# Patient Record
Sex: Female | Born: 1940 | Race: White | Hispanic: No | Marital: Married | Smoking: Never smoker
Health system: Southern US, Community
[De-identification: ages and names within clinical notes are randomized; demographics above are authoritative.]

## PROBLEM LIST (undated history)

## (undated) DIAGNOSIS — J189 Pneumonia, unspecified organism: Secondary | ICD-10-CM

## (undated) DIAGNOSIS — E785 Hyperlipidemia, unspecified: Secondary | ICD-10-CM

## (undated) DIAGNOSIS — E041 Nontoxic single thyroid nodule: Secondary | ICD-10-CM

## (undated) DIAGNOSIS — E039 Hypothyroidism, unspecified: Secondary | ICD-10-CM

## (undated) DIAGNOSIS — J302 Other seasonal allergic rhinitis: Secondary | ICD-10-CM

## (undated) DIAGNOSIS — R55 Syncope and collapse: Secondary | ICD-10-CM

## (undated) DIAGNOSIS — M1990 Unspecified osteoarthritis, unspecified site: Secondary | ICD-10-CM

## (undated) DIAGNOSIS — R42 Dizziness and giddiness: Secondary | ICD-10-CM

## (undated) DIAGNOSIS — I493 Ventricular premature depolarization: Secondary | ICD-10-CM

## (undated) DIAGNOSIS — E210 Primary hyperparathyroidism: Secondary | ICD-10-CM

## (undated) DIAGNOSIS — K635 Polyp of colon: Secondary | ICD-10-CM

## (undated) DIAGNOSIS — I10 Essential (primary) hypertension: Secondary | ICD-10-CM

## (undated) DIAGNOSIS — M8580 Other specified disorders of bone density and structure, unspecified site: Secondary | ICD-10-CM

## (undated) DIAGNOSIS — R030 Elevated blood-pressure reading, without diagnosis of hypertension: Secondary | ICD-10-CM

## (undated) DIAGNOSIS — H3530 Unspecified macular degeneration: Secondary | ICD-10-CM

## (undated) HISTORY — DX: Dizziness and giddiness: R42

## (undated) HISTORY — DX: Elevated blood-pressure reading, without diagnosis of hypertension: R03.0

## (undated) HISTORY — PX: PARATHYROIDECTOMY: SHX19

## (undated) HISTORY — DX: Other specified disorders of bone density and structure, unspecified site: M85.80

## (undated) HISTORY — DX: Nontoxic single thyroid nodule: E04.1

## (undated) HISTORY — DX: Hypothyroidism, unspecified: E03.9

## (undated) HISTORY — PX: COLONOSCOPY: SHX174

## (undated) HISTORY — DX: Hyperlipidemia, unspecified: E78.5

## (undated) HISTORY — DX: Syncope and collapse: R55

## (undated) HISTORY — DX: Polyp of colon: K63.5

## (undated) HISTORY — DX: Primary hyperparathyroidism: E21.0

## (undated) HISTORY — DX: Pneumonia, unspecified organism: J18.9

---

## 1986-07-18 HISTORY — PX: ABDOMINAL HYSTERECTOMY: SHX81

## 1998-07-18 HISTORY — PX: OTHER SURGICAL HISTORY: SHX169

## 1999-01-20 ENCOUNTER — Encounter: Payer: Self-pay | Admitting: Endocrinology

## 1999-01-20 ENCOUNTER — Ambulatory Visit (HOSPITAL_COMMUNITY): Admission: RE | Admit: 1999-01-20 | Discharge: 1999-01-20 | Payer: Self-pay | Admitting: Endocrinology

## 1999-03-04 ENCOUNTER — Encounter: Payer: Self-pay | Admitting: General Surgery

## 1999-03-09 ENCOUNTER — Ambulatory Visit (HOSPITAL_COMMUNITY): Admission: RE | Admit: 1999-03-09 | Discharge: 1999-03-10 | Payer: Self-pay | Admitting: General Surgery

## 1999-06-14 ENCOUNTER — Other Ambulatory Visit: Admission: RE | Admit: 1999-06-14 | Discharge: 1999-06-14 | Payer: Self-pay | Admitting: Obstetrics and Gynecology

## 2001-02-09 ENCOUNTER — Other Ambulatory Visit: Admission: RE | Admit: 2001-02-09 | Discharge: 2001-02-09 | Payer: Self-pay | Admitting: Obstetrics and Gynecology

## 2006-01-16 ENCOUNTER — Encounter: Admission: RE | Admit: 2006-01-16 | Discharge: 2006-01-16 | Payer: Self-pay | Admitting: Obstetrics and Gynecology

## 2006-08-01 ENCOUNTER — Encounter: Admission: RE | Admit: 2006-08-01 | Discharge: 2006-08-01 | Payer: Self-pay | Admitting: Obstetrics and Gynecology

## 2006-10-24 ENCOUNTER — Ambulatory Visit (HOSPITAL_COMMUNITY): Admission: RE | Admit: 2006-10-24 | Discharge: 2006-10-24 | Payer: Self-pay | Admitting: Endocrinology

## 2007-08-13 ENCOUNTER — Encounter: Admission: RE | Admit: 2007-08-13 | Discharge: 2007-08-13 | Payer: Self-pay | Admitting: Obstetrics and Gynecology

## 2008-07-09 ENCOUNTER — Ambulatory Visit (HOSPITAL_COMMUNITY): Admission: RE | Admit: 2008-07-09 | Discharge: 2008-07-09 | Payer: Self-pay | Admitting: Endocrinology

## 2008-10-28 ENCOUNTER — Encounter: Admission: RE | Admit: 2008-10-28 | Discharge: 2008-10-28 | Payer: Self-pay | Admitting: Obstetrics and Gynecology

## 2009-06-22 DIAGNOSIS — M899 Disorder of bone, unspecified: Secondary | ICD-10-CM | POA: Insufficient documentation

## 2009-06-22 DIAGNOSIS — E559 Vitamin D deficiency, unspecified: Secondary | ICD-10-CM | POA: Insufficient documentation

## 2009-06-22 DIAGNOSIS — E785 Hyperlipidemia, unspecified: Secondary | ICD-10-CM | POA: Insufficient documentation

## 2009-07-20 ENCOUNTER — Ambulatory Visit (HOSPITAL_COMMUNITY): Admission: RE | Admit: 2009-07-20 | Discharge: 2009-07-20 | Payer: Self-pay | Admitting: Endocrinology

## 2009-12-02 ENCOUNTER — Encounter: Admission: RE | Admit: 2009-12-02 | Discharge: 2009-12-02 | Payer: Self-pay | Admitting: Obstetrics and Gynecology

## 2010-04-29 ENCOUNTER — Encounter: Admission: RE | Admit: 2010-04-29 | Discharge: 2010-04-29 | Payer: Self-pay | Admitting: Otolaryngology

## 2010-08-04 ENCOUNTER — Ambulatory Visit (HOSPITAL_COMMUNITY)
Admission: RE | Admit: 2010-08-04 | Discharge: 2010-08-04 | Payer: Self-pay | Source: Home / Self Care | Attending: Endocrinology | Admitting: Endocrinology

## 2011-05-17 ENCOUNTER — Other Ambulatory Visit: Payer: Self-pay | Admitting: Obstetrics and Gynecology

## 2011-05-17 DIAGNOSIS — T148XXA Other injury of unspecified body region, initial encounter: Secondary | ICD-10-CM

## 2011-06-01 ENCOUNTER — Ambulatory Visit
Admission: RE | Admit: 2011-06-01 | Discharge: 2011-06-01 | Disposition: A | Discharge status: Home / Self Care | Payer: Medicare Other | Source: Ambulatory Visit | Attending: Obstetrics and Gynecology | Admitting: Obstetrics and Gynecology

## 2011-06-01 ENCOUNTER — Other Ambulatory Visit: Payer: Self-pay | Admitting: Obstetrics and Gynecology

## 2011-06-01 DIAGNOSIS — T148XXA Other injury of unspecified body region, initial encounter: Secondary | ICD-10-CM

## 2011-06-17 ENCOUNTER — Ambulatory Visit (INDEPENDENT_AMBULATORY_CARE_PROVIDER_SITE_OTHER): Payer: Self-pay | Admitting: Surgery

## 2011-06-17 ENCOUNTER — Ambulatory Visit (INDEPENDENT_AMBULATORY_CARE_PROVIDER_SITE_OTHER): Payer: Medicare Other | Admitting: Surgery

## 2011-06-17 ENCOUNTER — Encounter (INDEPENDENT_AMBULATORY_CARE_PROVIDER_SITE_OTHER): Payer: Self-pay | Admitting: Surgery

## 2011-06-17 VITALS — BP 138/86 | HR 80 | Temp 97.6°F | Resp 16 | Ht 64.0 in | Wt 188.0 lb

## 2011-06-17 DIAGNOSIS — N6459 Other signs and symptoms in breast: Secondary | ICD-10-CM

## 2011-06-17 DIAGNOSIS — N6452 Nipple discharge: Secondary | ICD-10-CM

## 2011-06-17 NOTE — Progress Notes (Signed)
Patient ID: Crystal Spears, female   DOB: 05/19/1941, 70 y.o.   MRN: [id number]  Chief Complaint  Patient presents with  . Other    Eval left breast nipple lesion    HPI Crystal Spears is a 70 y.o. female.  HPI The patient is sent at the request of Dr. Henderson Cloud due to bleeding from the left nipple. She was seen about a month ago due to swelling at the left nipple and bleeding. She underwent ultrasound and mammographic evaluation which showed a cystic structure in the superficial left nipple. She denies any pain. She denies any mass. About 2 weeks ago it resolved. Past Medical History  Diagnosis Date  . Hyperlipidemia   . Osteoporosis     Past Surgical History  Procedure Date  . Cesarean section 1972, 1978  . Abdominal hysterectomy 1988  . Parathyroid gland removal 2000    approximation of date    Family History  Problem Relation Age of Onset  . Stroke Mother 37  . Cancer Father 44    lung    Social History History  Substance Use Topics  . Smoking status: Never Smoker   . Smokeless tobacco: Never Used  . Alcohol Use: No    No Known Allergies  Current Outpatient Prescriptions  Medication Sig Dispense Refill  . atorvastatin (LIPITOR) 20 MG tablet Daily.      . Calcium Carbonate-Vitamin D (CALCIUM + D PO) Take by mouth daily.          Review of Systems Review of Systems  Constitutional: Negative for fever, chills and unexpected weight change.  HENT: Negative for hearing loss, congestion, sore throat, trouble swallowing and voice change.   Eyes: Negative for visual disturbance.  Respiratory: Negative for cough and wheezing.   Cardiovascular: Negative for chest pain, palpitations and leg swelling.  Gastrointestinal: Negative for nausea, vomiting, abdominal pain, diarrhea, constipation, blood in stool, abdominal distention and anal bleeding.  Genitourinary: Negative for hematuria, vaginal bleeding and difficulty urinating.  Musculoskeletal: Negative for arthralgias.    Skin: Negative for rash and wound.  Neurological: Negative for seizures, syncope and headaches.  Hematological: Negative for adenopathy. Does not bruise/bleed easily.  Psychiatric/Behavioral: Negative for confusion.    Blood pressure 138/86, pulse 80, temperature 97.6 F (36.4 C), temperature source Temporal, resp. rate 16, height 5\' 4"  (1.626 m), weight 188 lb (85.276 kg).  Physical Exam Physical Exam  Constitutional: She appears well-developed and well-nourished.  HENT:  Head: Normocephalic and atraumatic.  Eyes: EOM are normal. Pupils are equal, round, and reactive to light.  Neck: Normal range of motion. Neck supple.  Cardiovascular: Normal rate and regular rhythm.   No murmur heard. Pulmonary/Chest: Effort normal and breath sounds normal. No respiratory distress.       Both breasts examined. Left pressures a small 5 mm cystic structure within the nipple which appears to be a small varicosity. No mass in left breast. No drainage from left nipple. Left axilla normal. Right breast normal. Right axilla normal.    Data Reviewed Mammogram and left breast u/s  Small cystic structure surface of nipple.  Assessment      Varicosity of breast ( Mondor's disease)    Plan      Follow up three months.  It has resolved.  Call if symptoms recur.       [name]. 06/17/2011, 10:50 AM

## 2011-06-17 NOTE — Patient Instructions (Signed)
Follow up 3 months.  Cal if any further drainage or bleeding.

## 2011-07-19 HISTORY — PX: CATARACT EXTRACTION, BILATERAL: SHX1313

## 2011-08-05 ENCOUNTER — Other Ambulatory Visit (HOSPITAL_COMMUNITY): Payer: Self-pay | Admitting: *Deleted

## 2011-08-10 ENCOUNTER — Encounter (HOSPITAL_COMMUNITY): Payer: Self-pay

## 2011-08-10 ENCOUNTER — Encounter (HOSPITAL_COMMUNITY)
Admission: RE | Admit: 2011-08-10 | Discharge: 2011-08-10 | Disposition: A | Discharge status: Home / Self Care | Payer: Medicare Other | Source: Ambulatory Visit | Attending: Endocrinology | Admitting: Endocrinology

## 2011-08-10 DIAGNOSIS — M810 Age-related osteoporosis without current pathological fracture: Secondary | ICD-10-CM | POA: Insufficient documentation

## 2011-08-10 MED ORDER — ZOLEDRONIC ACID 5 MG/100ML IV SOLN
5.0000 mg | INTRAVENOUS | Status: AC
  Administered 2011-08-10: 5 mg via INTRAVENOUS
  Filled 2011-08-10: qty 100

## 2011-08-10 MED ORDER — SODIUM CHLORIDE 0.9 % IV SOLN
INTRAVENOUS | Status: AC
  Administered 2011-08-10: 10:00:00 via INTRAVENOUS

## 2011-10-28 ENCOUNTER — Encounter (INDEPENDENT_AMBULATORY_CARE_PROVIDER_SITE_OTHER): Payer: Self-pay | Admitting: Surgery

## 2011-10-28 ENCOUNTER — Ambulatory Visit (INDEPENDENT_AMBULATORY_CARE_PROVIDER_SITE_OTHER): Payer: Medicare Other | Admitting: Surgery

## 2011-10-28 VITALS — BP 130/80 | Ht 64.0 in | Wt 185.0 lb

## 2011-10-28 DIAGNOSIS — N6459 Other signs and symptoms in breast: Secondary | ICD-10-CM

## 2011-10-28 DIAGNOSIS — N6452 Nipple discharge: Secondary | ICD-10-CM

## 2011-10-28 NOTE — Patient Instructions (Signed)
You will be scheduled for surgery.

## 2011-10-28 NOTE — Progress Notes (Signed)
Patient ID: Crystal Spears, female   DOB: 02/06/1941, 70 y.o.   MRN: [id number]  No chief complaint on file.   HPI Crystal Spears is a 70 y.o. female.  HPI The patient is sent at the request of Dr. Horvath due to bleeding from the left nipple. She was seen about a month ago due to swelling at the left nipple and bleeding. She underwent ultrasound and mammographic evaluation which showed a cystic structure in the superficial left nipple. She denies any pain. She denies any mass. It continues to drain bloody fluid intermittently. Past Medical History  Diagnosis Date  . Hyperlipidemia   . Osteoporosis     Past Surgical History  Procedure Date  . Cesarean section 1972, 1978  . Abdominal hysterectomy 1988  . Parathyroid gland removal 2000    approximation of date    Family History  Problem Relation Age of Onset  . Stroke Mother 85  . Cancer Father 65    lung    Social History History  Substance Use Topics  . Smoking status: Never Smoker   . Smokeless tobacco: Never Used  . Alcohol Use: No    No Known Allergies  Current Outpatient Prescriptions  Medication Sig Dispense Refill  . atorvastatin (LIPITOR) 20 MG tablet Daily.      . Calcium Carbonate-Vitamin D (CALCIUM + D PO) Take by mouth 2 (two) times daily.       . Multiple Vitamin (MULTIVITAMIN) capsule Take 1 capsule by mouth daily.      . Vitamin D, Ergocalciferol, (DRISDOL) 50000 UNITS CAPS Take 50,000 Units by mouth every 30 (thirty) days.        Review of Systems Review of Systems  Constitutional: Negative for fever, chills and unexpected weight change.  HENT: Negative for hearing loss, congestion, sore throat, trouble swallowing and voice change.   Eyes: Negative for visual disturbance.  Respiratory: Negative for cough and wheezing.   Cardiovascular: Negative for chest pain, palpitations and leg swelling.  Gastrointestinal: Negative for nausea, vomiting, abdominal pain, diarrhea, constipation, blood in stool,  abdominal distention and anal bleeding.  Genitourinary: Negative for hematuria, vaginal bleeding and difficulty urinating.  Musculoskeletal: Negative for arthralgias.  Skin: Negative for rash and wound.  Neurological: Negative for seizures, syncope and headaches.  Hematological: Negative for adenopathy. Does not bruise/bleed easily.  Psychiatric/Behavioral: Negative for confusion.    Blood pressure 130/80, height 5' 4" (1.626 m), weight 185 lb (83.915 kg).  Physical Exam Physical Exam  Constitutional: She appears well-developed and well-nourished.  HENT:  Head: Normocephalic and atraumatic.  Eyes: EOM are normal. Pupils are equal, round, and reactive to light.  Neck: Normal range of motion. Neck supple.  Cardiovascular: Normal rate and regular rhythm.   No murmur heard. Pulmonary/Chest: Effort normal and breath sounds normal. No respiratory distress.       Both breasts examined. Left pressures a small 5 mm cystic structure within the nipple which appears to be a small varicosity. No mass in left breast. No drainage from left nipple. Left axilla normal. Right breast normal. Right axilla normal.    Data Reviewed Mammogram and left breast u/s  Small cystic structure surface of nipple.  Assessment     Left nipple cyst  And drainage  Not resolved    Plan     Left breast central duct excision.  The nipple itself will need the area excised since the cyst is in the actual skin along with central duct system.The procedure has been discussed   with the patient.  Alternative therapies have been discussed with the patient.  Operative risks include bleeding,  Infection, nipple loss  Organ injury,  Nerve injury,  Blood vessel injury,  DVT,  Pulmonary embolism,  Death,  And possible reoperation.  Medical management risks include worsening of present situation.  The success of the procedure is 50 -90 % at treating patients symptoms.  The patient understands and agrees to proceed.        Kaitlin Alcindor A. 10/28/2011, 10:27 AM    

## 2011-11-11 ENCOUNTER — Encounter (HOSPITAL_BASED_OUTPATIENT_CLINIC_OR_DEPARTMENT_OTHER): Payer: Self-pay | Admitting: *Deleted

## 2011-11-11 NOTE — Progress Notes (Signed)
To come in for ccs labs and cxr 

## 2011-11-14 ENCOUNTER — Encounter (HOSPITAL_BASED_OUTPATIENT_CLINIC_OR_DEPARTMENT_OTHER)
Admission: RE | Admit: 2011-11-14 | Discharge: 2011-11-14 | Disposition: A | Discharge status: Home / Self Care | Payer: Medicare Other | Source: Ambulatory Visit | Attending: Surgery | Admitting: Surgery

## 2011-11-14 ENCOUNTER — Ambulatory Visit
Admission: RE | Admit: 2011-11-14 | Discharge: 2011-11-14 | Disposition: A | Discharge status: Home / Self Care | Payer: Medicare Other | Source: Ambulatory Visit | Attending: Surgery | Admitting: Surgery

## 2011-11-14 LAB — COMPREHENSIVE METABOLIC PANEL
ALT: 29 U/L (ref 0–35)
AST: 28 U/L (ref 0–37)
Alkaline Phosphatase: 109 U/L (ref 39–117)
BUN: 17 mg/dL (ref 6–23)
CO2: 29 mEq/L (ref 19–32)
Calcium: 9.6 mg/dL (ref 8.4–10.5)
Creatinine, Ser: 0.8 mg/dL (ref 0.50–1.10)
GFR calc Af Amer: 86 mL/min — ABNORMAL LOW (ref >90)
Glucose, Bld: 83 mg/dL (ref 70–99)
Potassium: 4.7 mEq/L (ref 3.5–5.1)
Sodium: 142 mEq/L (ref 135–145)
Total Bilirubin: 0.5 mg/dL (ref 0.3–1.2)

## 2011-11-14 LAB — DIFFERENTIAL
Basophils Absolute: 0.1 10*3/uL (ref 0.0–0.1)
Eosinophils Absolute: 0.1 10*3/uL (ref 0.0–0.7)
Eosinophils Relative: 2 % (ref 0–5)
Neutro Abs: 3.4 10*3/uL (ref 1.7–7.7)
Neutrophils Relative %: 54 % (ref 43–77)

## 2011-11-14 LAB — CBC
MCH: 32.5 pg (ref 26.0–34.0)
MCV: 95.7 fL (ref 78.0–100.0)
Platelets: 263 10*3/uL (ref 150–400)
WBC: 6.4 10*3/uL (ref 4.0–10.5)

## 2011-11-16 ENCOUNTER — Encounter (HOSPITAL_BASED_OUTPATIENT_CLINIC_OR_DEPARTMENT_OTHER): Payer: Self-pay | Admitting: *Deleted

## 2011-11-16 ENCOUNTER — Ambulatory Visit (HOSPITAL_BASED_OUTPATIENT_CLINIC_OR_DEPARTMENT_OTHER)
Admission: RE | Admit: 2011-11-16 | Discharge: 2011-11-16 | Disposition: A | Discharge status: Home / Self Care | Payer: Medicare Other | Source: Ambulatory Visit | Attending: Surgery | Admitting: Surgery

## 2011-11-16 ENCOUNTER — Encounter (HOSPITAL_BASED_OUTPATIENT_CLINIC_OR_DEPARTMENT_OTHER)
Admission: RE | Disposition: A | Discharge status: Home / Self Care | Payer: Self-pay | Source: Ambulatory Visit | Attending: Surgery

## 2011-11-16 ENCOUNTER — Ambulatory Visit (HOSPITAL_BASED_OUTPATIENT_CLINIC_OR_DEPARTMENT_OTHER): Payer: Medicare Other | Admitting: Certified Registered Nurse Anesthetist

## 2011-11-16 ENCOUNTER — Encounter (HOSPITAL_BASED_OUTPATIENT_CLINIC_OR_DEPARTMENT_OTHER): Payer: Self-pay | Admitting: Certified Registered Nurse Anesthetist

## 2011-11-16 DIAGNOSIS — D249 Benign neoplasm of unspecified breast: Secondary | ICD-10-CM

## 2011-11-16 DIAGNOSIS — M810 Age-related osteoporosis without current pathological fracture: Secondary | ICD-10-CM | POA: Insufficient documentation

## 2011-11-16 DIAGNOSIS — E785 Hyperlipidemia, unspecified: Secondary | ICD-10-CM | POA: Insufficient documentation

## 2011-11-16 DIAGNOSIS — N6452 Nipple discharge: Secondary | ICD-10-CM

## 2011-11-16 HISTORY — DX: Other seasonal allergic rhinitis: J30.2

## 2011-11-16 HISTORY — PX: BREAST DUCTAL SYSTEM EXCISION: SHX5242

## 2011-11-16 HISTORY — PX: BREAST BIOPSY: SHX20

## 2011-11-16 SURGERY — EXCISION DUCTAL SYSTEM BREAST
Anesthesia: General | Site: Breast | Laterality: Left | Wound class: Clean

## 2011-11-16 MED ORDER — FENTANYL CITRATE 0.05 MG/ML IJ SOLN
INTRAMUSCULAR | Status: DC | PRN
  Administered 2011-11-16: 100 ug via INTRAVENOUS

## 2011-11-16 MED ORDER — ONDANSETRON HCL 4 MG/2ML IJ SOLN
INTRAMUSCULAR | Status: DC | PRN
  Administered 2011-11-16: 4 mg via INTRAVENOUS

## 2011-11-16 MED ORDER — MIDAZOLAM HCL 5 MG/5ML IJ SOLN
INTRAMUSCULAR | Status: DC | PRN
  Administered 2011-11-16: 2 mg via INTRAVENOUS

## 2011-11-16 MED ORDER — PROPOFOL 10 MG/ML IV EMUL
INTRAVENOUS | Status: DC | PRN
  Administered 2011-11-16: 160 mg via INTRAVENOUS

## 2011-11-16 MED ORDER — ONDANSETRON HCL 4 MG/2ML IJ SOLN: 4.0000 mg | Freq: Once | INTRAMUSCULAR | Status: DC | PRN

## 2011-11-16 MED ORDER — HYDROMORPHONE HCL PF 1 MG/ML IJ SOLN: 0.2500 mg | INTRAMUSCULAR | Status: DC | PRN

## 2011-11-16 MED ORDER — BUPIVACAINE-EPINEPHRINE 0.25% -1:200000 IJ SOLN
INTRAMUSCULAR | Status: DC | PRN
  Administered 2011-11-16: 10 mL

## 2011-11-16 MED ORDER — HYDROCODONE-ACETAMINOPHEN 5-325 MG PO TABS: 1.0000 | ORAL_TABLET | Freq: Four times a day (QID) | ORAL | Status: DC | PRN

## 2011-11-16 MED ORDER — DEXAMETHASONE SODIUM PHOSPHATE 4 MG/ML IJ SOLN
INTRAMUSCULAR | Status: DC | PRN
  Administered 2011-11-16: 10 mg via INTRAVENOUS

## 2011-11-16 MED ORDER — HYDROCODONE-ACETAMINOPHEN 5-325 MG PO TABS
1.0000 | ORAL_TABLET | Freq: Four times a day (QID) | ORAL | Status: DC | PRN
  Administered 2011-11-16: 1 via ORAL

## 2011-11-16 MED ORDER — CEFAZOLIN SODIUM-DEXTROSE 2-3 GM-% IV SOLR
2.0000 g | INTRAVENOUS | Status: AC
  Administered 2011-11-16: 2 g via INTRAVENOUS

## 2011-11-16 MED ORDER — LACTATED RINGERS IV SOLN
INTRAVENOUS | Status: DC
  Administered 2011-11-16: 08:00:00 via INTRAVENOUS

## 2011-11-16 SURGICAL SUPPLY — 43 items
ADH SKN CLS APL DERMABOND .7 (GAUZE/BANDAGES/DRESSINGS) ×1
BLADE SURG 15 STRL LF DISP TIS (BLADE) ×1 IMPLANT
BLADE SURG 15 STRL SS (BLADE) ×2
CANISTER SUCTION 1200CC (MISCELLANEOUS) ×2 IMPLANT
CHLORAPREP W/TINT 26ML (MISCELLANEOUS) ×2 IMPLANT
CLIP TI WIDE RED SMALL 6 (CLIP) IMPLANT
CLOTH BEACON ORANGE TIMEOUT ST (SAFETY) ×2 IMPLANT
COVER MAYO STAND STRL (DRAPES) ×2 IMPLANT
COVER TABLE BACK 60X90 (DRAPES) ×2 IMPLANT
DECANTER SPIKE VIAL GLASS SM (MISCELLANEOUS) ×2 IMPLANT
DERMABOND ADVANCED (GAUZE/BANDAGES/DRESSINGS) ×1
DERMABOND ADVANCED .7 DNX12 (GAUZE/BANDAGES/DRESSINGS) ×1 IMPLANT
DEVICE DUBIN W/COMP PLATE 8390 (MISCELLANEOUS) IMPLANT
DRAPE LAPAROSCOPIC ABDOMINAL (DRAPES) IMPLANT
DRAPE PED LAPAROTOMY (DRAPES) ×2 IMPLANT
DRAPE UTILITY XL STRL (DRAPES) ×2 IMPLANT
ELECT COATED BLADE 2.86 ST (ELECTRODE) ×2 IMPLANT
ELECT REM PT RETURN 9FT ADLT (ELECTROSURGICAL) ×2
ELECTRODE REM PT RTRN 9FT ADLT (ELECTROSURGICAL) ×1 IMPLANT
GLOVE BIOGEL M STRL SZ7.5 (GLOVE) ×1 IMPLANT
GLOVE BIOGEL PI IND STRL 8 (GLOVE) ×1 IMPLANT
GLOVE BIOGEL PI INDICATOR 8 (GLOVE) ×1
GLOVE ECLIPSE 8.0 STRL XLNG CF (GLOVE) ×2 IMPLANT
GOWN PREVENTION PLUS XLARGE (GOWN DISPOSABLE) ×2 IMPLANT
GOWN PREVENTION PLUS XXLARGE (GOWN DISPOSABLE) ×1 IMPLANT
NDL HYPO 25X1 1.5 SAFETY (NEEDLE) ×1 IMPLANT
NEEDLE HYPO 25X1 1.5 SAFETY (NEEDLE) ×2 IMPLANT
NS IRRIG 1000ML POUR BTL (IV SOLUTION) ×2 IMPLANT
PACK BASIN DAY SURGERY FS (CUSTOM PROCEDURE TRAY) ×2 IMPLANT
PENCIL BUTTON HOLSTER BLD 10FT (ELECTRODE) ×2 IMPLANT
SLEEVE SCD COMPRESS KNEE MED (MISCELLANEOUS) ×2 IMPLANT
SPONGE LAP 4X18 X RAY DECT (DISPOSABLE) ×2 IMPLANT
STAPLER VISISTAT 35W (STAPLE) IMPLANT
SUT MON AB 4-0 PC3 18 (SUTURE) ×2 IMPLANT
SUT SILK 2 0 SH (SUTURE) IMPLANT
SUT VIC AB 3-0 SH 27 (SUTURE) ×2
SUT VIC AB 3-0 SH 27X BRD (SUTURE) ×1 IMPLANT
SYR CONTROL 10ML LL (SYRINGE) ×2 IMPLANT
TOWEL OR 17X24 6PK STRL BLUE (TOWEL DISPOSABLE) ×3 IMPLANT
TOWEL OR NON WOVEN STRL DISP B (DISPOSABLE) ×2 IMPLANT
TUBE CONNECTING 20X1/4 (TUBING) ×2 IMPLANT
WATER STERILE IRR 1000ML POUR (IV SOLUTION) ×2 IMPLANT
YANKAUER SUCT BULB TIP NO VENT (SUCTIONS) ×2 IMPLANT

## 2011-11-16 NOTE — Anesthesia Postprocedure Evaluation (Signed)
  Anesthesia Post-op Note  Patient: Crystal Spears  Procedure(s) Performed: Procedure(s) (LRB): EXCISION DUCTAL SYSTEM BREAST (Left)  Patient Location: PACU  Anesthesia Type: General  Level of Consciousness: awake, alert  and oriented  Airway and Oxygen Therapy: Patient Spontanous Breathing  Post-op Pain: mild  Post-op Assessment: Post-op Vital signs reviewed and Patient's Cardiovascular Status Stable  Post-op Vital Signs: stable  Complications: No apparent anesthesia complications

## 2011-11-16 NOTE — Anesthesia Procedure Notes (Addendum)
Performed by: Signa Kell C   Procedure Name: LMA Insertion Date/Time: 11/16/2011 8:38 AM Performed by: Burna Cash Pre-anesthesia Checklist: Patient identified, Emergency Drugs available, Suction available and Patient being monitored Patient Re-evaluated:Patient Re-evaluated prior to inductionOxygen Delivery Method: Circle System Utilized Preoxygenation: Pre-oxygenation with 100% oxygen Intubation Type: IV induction Ventilation: Mask ventilation without difficulty LMA: LMA inserted LMA Size: 4.0 Number of attempts: 1 Airway Equipment and Method: bite block Placement Confirmation: positive ETCO2 Tube secured with: Tape Dental Injury: Teeth and Oropharynx as per pre-operative assessment

## 2011-11-16 NOTE — Anesthesia Preprocedure Evaluation (Signed)
Anesthesia Evaluation  Patient identified by MRN, date of birth, ID band Patient awake    Reviewed: Allergy & Precautions, H&P , NPO status , Patient's Chart, lab work & pertinent test results  Airway Mallampati: II TM Distance: >3 FB Neck ROM: Full    Dental  (+) Teeth Intact   Pulmonary  breath sounds clear to auscultation        Cardiovascular Rhythm:Regular Rate:Normal     Neuro/Psych    GI/Hepatic   Endo/Other    Renal/GU      Musculoskeletal   Abdominal   Peds  Hematology   Anesthesia Other Findings   Reproductive/Obstetrics                           Anesthesia Physical Anesthesia Plan  ASA: II  Anesthesia Plan: General   Post-op Pain Management:    Induction: Intravenous  Airway Management Planned: LMA  Additional Equipment:   Intra-op Plan:   Post-operative Plan:   Informed Consent: I have reviewed the patients History and Physical, chart, labs and discussed the procedure including the risks, benefits and alternatives for the proposed anesthesia with the patient or authorized representative who has indicated his/her understanding and acceptance.   Dental advisory given  Plan Discussed with:   Anesthesia Plan Comments: (Mild obesity L. Breast mass  Plan GA with LMA  Kipp Brood, MD)        Anesthesia Quick Evaluation

## 2011-11-16 NOTE — H&P (View-Only) (Signed)
Patient ID: Crystal Spears, female   DOB: August 26, 1940, 71 y.o.   MRN: [id number]  No chief complaint on file.   HPI Crystal Spears is a 71 y.o. female.  HPI The patient is sent at the request of Dr. Henderson Cloud due to bleeding from the left nipple. She was seen about a month ago due to swelling at the left nipple and bleeding. She underwent ultrasound and mammographic evaluation which showed a cystic structure in the superficial left nipple. She denies any pain. She denies any mass. It continues to drain bloody fluid intermittently. Past Medical History  Diagnosis Date  . Hyperlipidemia   . Osteoporosis     Past Surgical History  Procedure Date  . Cesarean section 1972, 1978  . Abdominal hysterectomy 1988  . Parathyroid gland removal 2000    approximation of date    Family History  Problem Relation Age of Onset  . Stroke Mother 90  . Cancer Father 56    lung    Social History History  Substance Use Topics  . Smoking status: Never Smoker   . Smokeless tobacco: Never Used  . Alcohol Use: No    No Known Allergies  Current Outpatient Prescriptions  Medication Sig Dispense Refill  . atorvastatin (LIPITOR) 20 MG tablet Daily.      . Calcium Carbonate-Vitamin D (CALCIUM + D PO) Take by mouth 2 (two) times daily.       . Multiple Vitamin (MULTIVITAMIN) capsule Take 1 capsule by mouth daily.      . Vitamin D, Ergocalciferol, (DRISDOL) 50000 UNITS CAPS Take 50,000 Units by mouth every 30 (thirty) days.        Review of Systems Review of Systems  Constitutional: Negative for fever, chills and unexpected weight change.  HENT: Negative for hearing loss, congestion, sore throat, trouble swallowing and voice change.   Eyes: Negative for visual disturbance.  Respiratory: Negative for cough and wheezing.   Cardiovascular: Negative for chest pain, palpitations and leg swelling.  Gastrointestinal: Negative for nausea, vomiting, abdominal pain, diarrhea, constipation, blood in stool,  abdominal distention and anal bleeding.  Genitourinary: Negative for hematuria, vaginal bleeding and difficulty urinating.  Musculoskeletal: Negative for arthralgias.  Skin: Negative for rash and wound.  Neurological: Negative for seizures, syncope and headaches.  Hematological: Negative for adenopathy. Does not bruise/bleed easily.  Psychiatric/Behavioral: Negative for confusion.    Blood pressure 130/80, height 5\' 4"  (1.626 m), weight 185 lb (83.915 kg).  Physical Exam Physical Exam  Constitutional: She appears well-developed and well-nourished.  HENT:  Head: Normocephalic and atraumatic.  Eyes: EOM are normal. Pupils are equal, round, and reactive to light.  Neck: Normal range of motion. Neck supple.  Cardiovascular: Normal rate and regular rhythm.   No murmur heard. Pulmonary/Chest: Effort normal and breath sounds normal. No respiratory distress.       Both breasts examined. Left pressures a small 5 mm cystic structure within the nipple which appears to be a small varicosity. No mass in left breast. No drainage from left nipple. Left axilla normal. Right breast normal. Right axilla normal.    Data Reviewed Mammogram and left breast u/s  Small cystic structure surface of nipple.  Assessment     Left nipple cyst  And drainage  Not resolved    Plan     Left breast central duct excision.  The nipple itself will need the area excised since the cyst is in the actual skin along with central duct system.The procedure has been discussed  with the patient.  Alternative therapies have been discussed with the patient.  Operative risks include bleeding,  Infection, nipple loss  Organ injury,  Nerve injury,  Blood vessel injury,  DVT,  Pulmonary embolism,  Death,  And possible reoperation.  Medical management risks include worsening of present situation.  The success of the procedure is 50 -90 % at treating patients symptoms.  The patient understands and agrees to proceed.        Daxtyn Rottenberg A. 10/28/2011, 10:27 AM

## 2011-11-16 NOTE — Discharge Instructions (Signed)
[facility] °Office Phone Number [phone] ° °BREAST BIOPSY/ PARTIAL MASTECTOMY: POST OP INSTRUCTIONS ° °Always review your discharge instruction sheet given to you by the facility where your surgery was performed. ° °IF YOU HAVE DISABILITY OR FAMILY LEAVE FORMS, YOU MUST BRING THEM TO THE OFFICE FOR PROCESSING.  DO NOT GIVE THEM TO YOUR DOCTOR. ° °1. A prescription for pain medication may be given to you upon discharge.  Take your pain medication as prescribed, if needed.  If narcotic pain medicine is not needed, then you may take acetaminophen (Tylenol) or ibuprofen (Advil) as needed. °2. Take your usually prescribed medications unless otherwise directed °3. If you need a refill on your pain medication, please contact your pharmacy.  They will contact our office to request authorization.  Prescriptions will not be filled after 5pm or on week-ends. °4. You should eat very light the first 24 hours after surgery, such as soup, crackers, pudding, etc.  Resume your normal diet the day after surgery. °5. Most patients will experience some swelling and bruising in the breast.  Ice packs and a good support bra will help.  Swelling and bruising can take several days to resolve.  °6. It is common to experience some constipation if taking pain medication after surgery.  Increasing fluid intake and taking a stool softener will usually help or prevent this problem from occurring.  A mild laxative (Milk of Magnesia or Miralax) should be taken according to package directions if there are no bowel movements after 48 hours. °7. Unless discharge instructions indicate otherwise, you may remove your bandages 24-48 hours after surgery, and you may shower at that time.  You may have steri-strips (small skin tapes) in place directly over the incision.  These strips should be left on the skin for 7-10 days.  If your surgeon used skin glue on the incision, you may shower in 24 hours.  The glue will flake off over the  next 2-3 weeks.  Any sutures or staples will be removed at the office during your follow-up visit. °8. ACTIVITIES:  You may resume regular daily activities (gradually increasing) beginning the next day.  Wearing a good support bra or sports bra minimizes pain and swelling.  You may have sexual intercourse when it is comfortable. °a. You may drive when you no longer are taking prescription pain medication, you can comfortably wear a seatbelt, and you can safely maneuver your car and apply brakes. °b. RETURN TO WORK:  ______________________________________________________________________________________ °9. You should see your doctor in the office for a follow-up appointment approximately two weeks after your surgery.  Your doctor’s nurse will typically make your follow-up appointment when she calls you with your pathology report.  Expect your pathology report 2-3 business days after your surgery.  You may call to check if you do not hear from us after three days. °10. OTHER INSTRUCTIONS: _______________________________________________________________________________________________ _____________________________________________________________________________________________________________________________________ °_____________________________________________________________________________________________________________________________________ °_____________________________________________________________________________________________________________________________________ ° °WHEN TO CALL YOUR DOCTOR: °1. Fever over 101.0 °2. Nausea and/or vomiting. °3. Extreme swelling or bruising. °4. Continued bleeding from incision. °5. Increased pain, redness, or drainage from the incision. ° °The clinic staff is available to answer your questions during regular business hours.  Please don’t hesitate to call and ask to speak to one of the nurses for clinical concerns.  If you have a medical emergency, go to the nearest  emergency room or call 911.  A surgeon from [facility] is always on call at the hospital. ° °For further questions, please visit [email or web address]  ° ° °  Post Anesthesia Home Care Instructions ° °Activity: °Get plenty of rest for the remainder of the day. A responsible adult should stay with you for 24 hours following the procedure.  °For the next 24 hours, DO NOT: °-Drive a car °-Operate machinery °-Drink alcoholic beverages °-Take any medication unless instructed by your physician °-Make any legal decisions or sign important papers. ° °Meals: °Start with liquid foods such as gelatin or soup. Progress to regular foods as tolerated. Avoid greasy, spicy, heavy foods. If nausea and/or vomiting occur, drink only clear liquids until the nausea and/or vomiting subsides. Call your physician if vomiting continues. ° °Special Instructions/Symptoms: °Your throat may feel dry or sore from the anesthesia or the breathing tube placed in your throat during surgery. If this causes discomfort, gargle with warm salt water. The discomfort should disappear within 24 hours. ° ° °Call your surgeon if you experience:  ° °1.  Fever over 101.0. °2.  Inability to urinate. °3.  Nausea and/or vomiting. °4.  Extreme swelling or bruising at the surgical site. °5.  Continued bleeding from the incision. °6.  Increased pain, redness or drainage from the incision. °7.  Problems related to your pain medication. ° ° °

## 2011-11-16 NOTE — Op Note (Signed)
Preop diagnosis:   Left breast Bloody nipple discharge  Postop diagnosis: Same  Procedure: Central breast duct excision  Surgeon: Harriette Bouillon M.D.  Anesthesia: Gen. endotracheal with local anesthesia  EBL: Minimal  Specimen: Breast tissue to pathology  Drains: None  Indications for procedure: The patient presents for central duct excision of the breast due to bloody nipple discharge. Workup included mammography and ultrasound. A filling defect was noted in the central ducts of the breast. Discussion of operative and nonoperative means were done with the patient. Risks of operative excision include bleeding, infection, recurrence, and the need for the surgery, seroma, anesthesia risks. Observation is the other choice discussed. The patient wishes to proceed for excision.  Description of procedure: The patient was seen all the area. Questions are answered. The left breast was marked. The patient was taken back to the operating room. The patient was placed supine on the operating room table. General anesthesia was initiated. Upper chest was prepped and draped in a sterile fashion. A timeout was done. She received preoperative antibiotics within an hour of incision. A vertical  incision was made from the border of the nipple from 6 oclock to the central nipple. The nipple was elevated carefully. The central duct was identified using  a probe and correlated with preoperative imaging. The duct and neighboring tissue were excised. This was sent to pathology. The wound was irrigated and then closed with 3-0 Vicryl and 4-0 Monocryl in a subcuticular fashion Dermabond was applied. Patient was awoke and taken to recovery in satisfactory condition. All final counts the sponge, instruments and needles were correct.

## 2011-11-16 NOTE — Transfer of Care (Signed)
Immediate Anesthesia Transfer of Care Note  Patient: Crystal Spears  Procedure(s) Performed: Procedure(s) (LRB): EXCISION DUCTAL SYSTEM BREAST (Left)  Patient Location: PACU  Anesthesia Type: General  Level of Consciousness: sedated  Airway & Oxygen Therapy: Patient Spontanous Breathing and Patient connected to face mask oxygen  Post-op Assessment: Report given to PACU RN and Post -op Vital signs reviewed and stable  Post vital signs: Reviewed and stable  Complications: No apparent anesthesia complications

## 2011-11-16 NOTE — Interval H&P Note (Signed)
History and Physical Interval Note:  11/16/2011 8:19 AM  Crystal Spears  has presented today for surgery, with the diagnosis of left nipple dischage  The various methods of treatment have been discussed with the patient and family. After consideration of risks, benefits and other options for treatment, the patient has consented to  Procedure(s) (LRB): EXCISION DUCTAL SYSTEM BREAST (Left) as a surgical intervention .  The patients' history has been reviewed, patient examined, no change in status, stable for surgery.  I have reviewed the patients' chart and labs.  Questions were answered to the patient's satisfaction.     Lavada Langsam A.

## 2011-11-18 ENCOUNTER — Telehealth (INDEPENDENT_AMBULATORY_CARE_PROVIDER_SITE_OTHER): Payer: Self-pay

## 2011-11-18 ENCOUNTER — Encounter (HOSPITAL_BASED_OUTPATIENT_CLINIC_OR_DEPARTMENT_OTHER): Payer: Self-pay | Admitting: Surgery

## 2011-11-18 NOTE — Telephone Encounter (Signed)
Pt called for path result. Path result given to pt and copy to [facility] for pts office appt.Marland Kitchen

## 2011-11-25 ENCOUNTER — Encounter (INDEPENDENT_AMBULATORY_CARE_PROVIDER_SITE_OTHER): Payer: Self-pay | Admitting: Surgery

## 2011-11-25 ENCOUNTER — Ambulatory Visit (INDEPENDENT_AMBULATORY_CARE_PROVIDER_SITE_OTHER): Payer: Medicare Other | Admitting: Surgery

## 2011-11-25 VITALS — BP 124/80 | HR 68 | Temp 97.0°F | Resp 16 | Ht 64.0 in | Wt 182.8 lb

## 2011-11-25 DIAGNOSIS — Z9889 Other specified postprocedural states: Secondary | ICD-10-CM

## 2011-11-25 NOTE — Progress Notes (Signed)
Patient returns after her left breast central duct excision. She has no complaints. 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  Exam: Small seroma on her left nipple. No redness. Incision clean dry and intact.  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 A swelling not resolved in 3-4 weeks a she will  call us back.

## 2011-11-25 NOTE — Patient Instructions (Signed)
Return if swelling does not improve

## 2013-11-28 ENCOUNTER — Other Ambulatory Visit: Payer: Self-pay | Admitting: Obstetrics and Gynecology

## 2013-12-03 ENCOUNTER — Other Ambulatory Visit: Payer: Self-pay | Admitting: Obstetrics and Gynecology

## 2013-12-03 DIAGNOSIS — R928 Other abnormal and inconclusive findings on diagnostic imaging of breast: Secondary | ICD-10-CM

## 2013-12-05 ENCOUNTER — Ambulatory Visit
Admission: RE | Admit: 2013-12-05 | Discharge: 2013-12-05 | Disposition: A | Discharge status: Home / Self Care | Payer: Medicare Other | Source: Ambulatory Visit | Attending: Obstetrics and Gynecology | Admitting: Obstetrics and Gynecology

## 2013-12-05 DIAGNOSIS — R928 Other abnormal and inconclusive findings on diagnostic imaging of breast: Secondary | ICD-10-CM

## 2014-08-05 DIAGNOSIS — D126 Benign neoplasm of colon, unspecified: Secondary | ICD-10-CM | POA: Insufficient documentation

## 2015-08-25 ENCOUNTER — Other Ambulatory Visit: Payer: Self-pay | Admitting: Obstetrics and Gynecology

## 2015-08-25 DIAGNOSIS — R928 Other abnormal and inconclusive findings on diagnostic imaging of breast: Secondary | ICD-10-CM

## 2015-08-31 ENCOUNTER — Ambulatory Visit
Admission: RE | Admit: 2015-08-31 | Discharge: 2015-08-31 | Disposition: A | Discharge status: Home / Self Care | Payer: Medicare Other | Source: Ambulatory Visit | Attending: Obstetrics and Gynecology | Admitting: Obstetrics and Gynecology

## 2015-08-31 ENCOUNTER — Other Ambulatory Visit: Payer: Self-pay | Admitting: Obstetrics and Gynecology

## 2015-08-31 DIAGNOSIS — R928 Other abnormal and inconclusive findings on diagnostic imaging of breast: Secondary | ICD-10-CM

## 2015-09-08 ENCOUNTER — Ambulatory Visit
Admission: RE | Admit: 2015-09-08 | Discharge: 2015-09-08 | Disposition: A | Discharge status: Home / Self Care | Payer: Medicare Other | Source: Ambulatory Visit | Attending: Obstetrics and Gynecology | Admitting: Obstetrics and Gynecology

## 2015-09-08 DIAGNOSIS — R928 Other abnormal and inconclusive findings on diagnostic imaging of breast: Secondary | ICD-10-CM

## 2015-09-08 HISTORY — PX: BREAST CYST ASPIRATION: SHX578

## 2017-03-21 ENCOUNTER — Other Ambulatory Visit: Payer: Self-pay | Admitting: Obstetrics and Gynecology

## 2017-03-21 DIAGNOSIS — Z1231 Encounter for screening mammogram for malignant neoplasm of breast: Secondary | ICD-10-CM

## 2017-03-29 ENCOUNTER — Ambulatory Visit: Payer: Medicare Other

## 2017-03-30 ENCOUNTER — Ambulatory Visit
Admission: RE | Admit: 2017-03-30 | Discharge: 2017-03-30 | Disposition: A | Discharge status: Home / Self Care | Payer: Medicare Other | Source: Ambulatory Visit | Attending: Obstetrics and Gynecology | Admitting: Obstetrics and Gynecology

## 2017-03-30 DIAGNOSIS — Z1231 Encounter for screening mammogram for malignant neoplasm of breast: Secondary | ICD-10-CM

## 2017-04-14 ENCOUNTER — Emergency Department (HOSPITAL_COMMUNITY)
Admission: EM | Admit: 2017-04-14 | Discharge: 2017-04-14 | Disposition: A | Discharge status: Home / Self Care | Payer: Medicare Other | Attending: Emergency Medicine | Admitting: Emergency Medicine

## 2017-04-14 DIAGNOSIS — Z79899 Other long term (current) drug therapy: Secondary | ICD-10-CM | POA: Diagnosis not present

## 2017-04-14 DIAGNOSIS — R55 Syncope and collapse: Secondary | ICD-10-CM | POA: Diagnosis not present

## 2017-04-14 DIAGNOSIS — R42 Dizziness and giddiness: Secondary | ICD-10-CM | POA: Diagnosis present

## 2017-04-14 LAB — BASIC METABOLIC PANEL
Anion gap: 8 (ref 5–15)
BUN: 19 mg/dL (ref 6–20)
CO2: 25 mmol/L (ref 22–32)
Calcium: 9.1 mg/dL (ref 8.9–10.3)
Chloride: 104 mmol/L (ref 101–111)
Creatinine, Ser: 1.05 mg/dL — ABNORMAL HIGH (ref 0.44–1.00)
GFR calc Af Amer: 60 mL/min — ABNORMAL LOW (ref >60)
GFR calc non Af Amer: 51 mL/min — ABNORMAL LOW (ref >60)
Glucose, Bld: 124 mg/dL — ABNORMAL HIGH (ref 65–99)
Potassium: 4.2 mmol/L (ref 3.5–5.1)
Sodium: 137 mmol/L (ref 135–145)

## 2017-04-14 LAB — URINALYSIS, ROUTINE W REFLEX MICROSCOPIC
Bilirubin Urine: NEGATIVE
Glucose, UA: NEGATIVE mg/dL
Hgb urine dipstick: NEGATIVE
Ketones, ur: NEGATIVE mg/dL
Nitrite: NEGATIVE
Protein, ur: NEGATIVE mg/dL
Specific Gravity, Urine: 1.013 (ref 1.005–1.030)
pH: 5 (ref 5.0–8.0)

## 2017-04-14 LAB — CBC
HCT: 40.8 % (ref 36.0–46.0)
Hemoglobin: 13.6 g/dL (ref 12.0–15.0)
MCH: 32.2 pg (ref 26.0–34.0)
MCHC: 33.3 g/dL (ref 30.0–36.0)
MCV: 96.7 fL (ref 78.0–100.0)
Platelets: 228 10*3/uL (ref 150–400)
RBC: 4.22 MIL/uL (ref 3.87–5.11)
RDW: 12.1 % (ref 11.5–15.5)
WBC: 7.4 10*3/uL (ref 4.0–10.5)

## 2017-04-14 LAB — CBG MONITORING, ED: Glucose-Capillary: 116 mg/dL — ABNORMAL HIGH (ref 65–99)

## 2017-04-14 NOTE — ED Provider Notes (Signed)
[facility] DEPT Provider Note   CSN: [id number] Arrival date & time: 04/14/17  1943     History   Chief Complaint Chief Complaint  Patient presents with  . Dizziness    HPI Crystal Spears is a 76 y.o. female.  HPI   76 year old female with dizziness. Intermittent symptoms for weeks. 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 She walked herself to the first aid station and EMS was called. She did report feeling a little bit better than she was ambulating. She has had similar symptoms but not quite as severe and similar situations when standing and relatively fixed space. These symptoms have also previously improved when moving around. Denies any chest pain.  Past Medical History:  Diagnosis Date  . Hyperlipidemia   . Osteoporosis   . Seasonal allergies     There are no active problems to display for this patient.   Past Surgical History:  Procedure Laterality Date  . ABDOMINAL HYSTERECTOMY  1988  . BREAST DUCTAL SYSTEM EXCISION  11/16/2011   Procedure: EXCISION DUCTAL SYSTEM BREAST;  Surgeon: Joyice Faster. Cornett, MD;  Location: [facility];  Service: General;  Laterality: Left;  left breast central duct excision  . [place]  . parathyroid gland removal  2000   approximation of date    OB History    No data available       Home Medications    Prior to Admission medications   Medication Sig Start Date End Date Taking? Authorizing Provider  atorvastatin (LIPITOR) 20 MG tablet Daily. 06/15/11   [provider]  Calcium Carbonate-Vitamin D (CALCIUM + D PO) Take by mouth 2 (two) times daily.     [provider]  loratadine (CLARITIN) 10 MG tablet Take 10 mg by mouth as needed.     [provider]  Multiple Vitamin (MULTIVITAMIN) capsule Take 1 capsule by mouth daily.    [provider]  Vitamin D, Ergocalciferol, (DRISDOL) 50000 UNITS CAPS Take  50,000 Units by mouth every 30 (thirty) days.    [provider]    Family History Family History  Problem Relation Age of Onset  . Stroke Mother 59  . Cancer Father 78       lung  . Breast cancer Neg Hx     Social History Social History  Substance Use Topics  . Smoking status: Never Smoker  . Smokeless tobacco: Never Used  . Alcohol use No     Allergies   Patient has no known allergies.   Review of Systems Review of Systems  All systems reviewed and negative, other than as noted in HPI.  Physical Exam Updated Vital Signs BP (!) 150/69 (BP Location: Right Arm)   Pulse 80   Temp (!) 97.2 F (36.2 C) (Oral)   Resp 20   SpO2 100%   Physical Exam  Constitutional: She appears well-developed and well-nourished. No distress.  HENT:  Head: Normocephalic and atraumatic.  Eyes: Conjunctivae are normal. Right eye exhibits no discharge. Left eye exhibits no discharge.  Neck: Neck supple.  Cardiovascular: Normal rate, regular rhythm and normal heart sounds.  Exam reveals no gallop and no friction rub.   No murmur heard. Pulmonary/Chest: Effort normal and breath sounds normal. No respiratory distress.  Abdominal: Soft. She exhibits no distension. There is no tenderness.  Musculoskeletal: She exhibits no edema or tenderness.  Neurological: She is alert.  Skin: Skin is warm  and dry.  Psychiatric: She has a normal mood and affect. Her behavior is normal. Thought content normal.  Nursing note and vitals reviewed.    ED Treatments / Results  Labs (all labs ordered are listed, but only abnormal results are displayed) Labs Reviewed  CBG MONITORING, ED - Abnormal; Notable for the following:       Result Value   Glucose-Capillary 116 (*)    All other components within normal limits  BASIC METABOLIC PANEL  CBC  URINALYSIS, ROUTINE W REFLEX MICROSCOPIC    EKG  EKG Interpretation  Date/Time:  Friday April 14 2017 19:56:37 EDT Ventricular Rate:  80 PR  Interval:    QRS Duration: 94 QT Interval:  380 QTC Calculation: 439 R Axis:   -29 Text Interpretation:  Sinus rhythm Borderline left axis deviation Low voltage, precordial leads Abnormal R-wave progression, early transition Confirmed by Virgel Manifold [phone]) on 04/14/2017 8:09:31 PM Also confirmed by Virgel Manifold [phone]), editor Drema Pry [phone])  on 04/15/2017 8:41:21 AM       Radiology No results found.  Procedures Procedures (including critical care time)  Medications Ordered in ED Medications - No data to display   Initial Impression / Assessment and Plan / ED Course  I have reviewed the triage vital signs and the nursing notes.  Pertinent labs & imaging results that were available during my care of the patient were reviewed by me and considered in my medical decision making (see chart for details).     76 year old female with recurrent near syncopal symptoms. Several episodes recently. Episodes seem to happen when standing relatively still for extended periods of time. She has had the same symptoms previously while doing concessions at [employer]. She also reports that she was standing talking to a friend "who is a real talker" while in the grocery store when she had similar milder symptoms and also while standing in the kitchen cooking and when standing for church service.  This really sounds postural and no other particularly concerning features.  Symptoms are actually improved when she then walks around. She denies any pain with them. No palpitations.   Her ED workup was fairly unremarkable. She is currently asymptomatic. Advised to try not to stand, particularly with knees locked, for long periods. Take breaks or walk around a little bit. Recommended following up with her PCP to discuss further.  Final Clinical Impressions(s) / ED Diagnoses   Final diagnoses:  Syncope, near    New Prescriptions New Prescriptions   No medications on file     Virgel Manifold, MD 04/26/17 [phone]

## 2017-04-14 NOTE — ED Triage Notes (Signed)
[place] [employer] [place] she was working setting up concessions and began to feel dizziness with nausea.  Walked to first aid station and EMS was called. Pt was initially hypertensive at 200/100 but en route repeat BP was 166/80.  Pt has no neuro deficits. States she has had these "episodes" every few days for the past few weeks and they resolve on their own. Currently only dizzy with standing, not while at rest.

## 2017-04-14 NOTE — Discharge Instructions (Signed)
Your symptoms seem to be related to standing still or relatively still for longer periods of time. Because of gravity, our blood can pool in the lower extremities to some degree. The mechanical action of our muscles with movement actually counteracts this to some degree. Try to avoid standing for long periods of time, particularly with your knees locked. Take breaks or walk around a little bit. If you begin to feel faint then walk some. If you don't feel better quickly with this then lay or sit down and elevate your legs. Follow-up with Dr Forde Dandy.

## 2017-04-18 ENCOUNTER — Other Ambulatory Visit: Payer: Self-pay | Admitting: Endocrinology

## 2017-04-18 DIAGNOSIS — R55 Syncope and collapse: Secondary | ICD-10-CM

## 2017-04-18 DIAGNOSIS — R42 Dizziness and giddiness: Secondary | ICD-10-CM

## 2017-04-19 ENCOUNTER — Ambulatory Visit
Admission: RE | Admit: 2017-04-19 | Discharge: 2017-04-19 | Disposition: A | Discharge status: Home / Self Care | Payer: Medicare Other | Source: Ambulatory Visit | Attending: Endocrinology | Admitting: Endocrinology

## 2017-04-19 DIAGNOSIS — R42 Dizziness and giddiness: Secondary | ICD-10-CM

## 2017-04-19 DIAGNOSIS — R55 Syncope and collapse: Secondary | ICD-10-CM

## 2017-04-19 MED ORDER — IOPAMIDOL (ISOVUE-300) INJECTION 61%
75.0000 mL | Freq: Once | INTRAVENOUS | Status: AC | PRN
Start: 2017-04-19 — End: 2017-04-19
  Administered 2017-04-19: 75 mL via INTRAVENOUS

## 2017-05-02 ENCOUNTER — Ambulatory Visit (INDEPENDENT_AMBULATORY_CARE_PROVIDER_SITE_OTHER): Payer: Medicare Other

## 2017-05-02 DIAGNOSIS — R55 Syncope and collapse: Secondary | ICD-10-CM | POA: Insufficient documentation

## 2017-05-11 ENCOUNTER — Telehealth: Payer: Self-pay | Admitting: *Deleted

## 2017-05-11 NOTE — Telephone Encounter (Signed)
Crystal Spears at [facility] and informed holter monitor was imported and assign to Dr. Marlou Porch to read on 05/08/17.  The report has not been read at this point.   Dr. Marlou Porch is in the office today and his team has been notified and given the phone and fax number for Dr. Forde Dandy to send results ASAP.

## 2017-05-11 NOTE — Telephone Encounter (Signed)
Crystal Spears ( Dr. Forde Dandy Office ) is calling about the results of a 24hr Holter Monitor that was placed on Crystal Spears. Please fax results to [phone].. Thank you

## 2017-05-12 DIAGNOSIS — E041 Nontoxic single thyroid nodule: Secondary | ICD-10-CM | POA: Insufficient documentation

## 2017-05-15 ENCOUNTER — Encounter: Payer: Self-pay | Admitting: Interventional Cardiology

## 2017-05-15 ENCOUNTER — Ambulatory Visit (INDEPENDENT_AMBULATORY_CARE_PROVIDER_SITE_OTHER): Payer: Medicare Other | Admitting: Interventional Cardiology

## 2017-05-15 VITALS — BP 156/82 | HR 72 | Ht 64.0 in | Wt 184.4 lb

## 2017-05-15 DIAGNOSIS — I10 Essential (primary) hypertension: Secondary | ICD-10-CM | POA: Diagnosis not present

## 2017-05-15 DIAGNOSIS — R42 Dizziness and giddiness: Secondary | ICD-10-CM | POA: Diagnosis not present

## 2017-05-15 DIAGNOSIS — I493 Ventricular premature depolarization: Secondary | ICD-10-CM

## 2017-05-15 MED ORDER — LISINOPRIL 10 MG PO TABS: 10.0000 mg | ORAL_TABLET | Freq: Every day | ORAL | 3 refills | Status: DC

## 2017-05-15 NOTE — Patient Instructions (Signed)
Medication Instructions:  Your physician has recommended you make the following change in your medication:   START: lisinopril 10 mg daily    Labwork: Your physician recommends that you return for lab work in: 1 week for basic metabolic panel    Testing/Procedures: None ordered   Follow-Up: Your physician recommends that you schedule a follow-up appointment with the hypertension clinic for blood pressure medication management in 2 weeks or first available.   Your physician wants you to follow-up in: 1 year with Dr. Irish Lack. You will receive a reminder letter in the mail two months in advance. If you don't receive a letter, please call our office to schedule the follow-up appointment.   Any Other Special Instructions Will Be Listed Below (If Applicable).     If you need a refill on your cardiac medications before your next appointment, please call your pharmacy.

## 2017-05-15 NOTE — Progress Notes (Addendum)
Cardiology Office Note   Date:  05/15/2017   ID:  Crystal Spears, Fettes 1941/02/28, MRN [id number]  PCP:  Reynold Bowen, MD    No chief complaint on file. Increased BP    Wt Readings from Last 3 Encounters:  05/15/17 184 lb 6.4 oz (83.6 kg)  11/25/11 182 lb 12.8 oz (82.9 kg)  10/28/11 185 lb (83.9 kg)       History of Present Illness: Crystal Spears is a 76 y.o. female who is being seen today for the evaluation of lightheadedness at the request of [place], Annie Main, MD.  A month ago, she had an episode of lightheadedness and she was close to a fire station.  She had a systolic BP of > 542 mm Hg.  She went to the hospital and was observed.  The w/u was negative.  She wore a Holter monitor and had some blood work that was negative.  Holter showed NSR, about 10 % PVCs.  She did not have significant sx with the monitor, she reports today.  No chest pain.  No palpitations.  She has had lightheadedness on a few other occasions.  She would rest for a few minutes and it would go away.    She walks occasionally.  But not regularly.    Home readings typically in the 706-237 systolic range.     Past Medical History:  Diagnosis Date  . Colon polyps   . Dizziness   . Elevated blood pressure reading without diagnosis of hypertension   . Hyperlipidemia   . Hyperparathyroidism, primary ([place])    S/P parathyroidectomy  . Hypothyroidism   . Near syncope   . Osteopenia   . Osteoporosis   . Pneumonia   . Seasonal allergies   . Thyroid nodule    HYPERPLASTIC    Past Surgical History:  Procedure Laterality Date  . ABDOMINAL HYSTERECTOMY  1988  . BREAST DUCTAL SYSTEM EXCISION  11/16/2011   Procedure: EXCISION DUCTAL SYSTEM BREAST;  Surgeon: Joyice Faster. Cornett, MD;  Location: [facility];  Service: General;  Laterality: Left;  left breast central duct excision  . CATARACT EXTRACTION, BILATERAL Bilateral 2013  . [place]  . parathyroid gland removal   2000   approximation of date  . PARATHYROIDECTOMY       Current Outpatient Prescriptions  Medication Sig Dispense Refill  . atorvastatin (LIPITOR) 20 MG tablet Daily.    . Calcium Carbonate-Vitamin D (CALCIUM + D PO) Take by mouth 2 (two) times daily.     Marland Kitchen loratadine (CLARITIN) 10 MG tablet Take 10 mg by mouth as needed.     . Multiple Vitamin (MULTIVITAMIN) capsule Take 1 capsule by mouth daily.    . Multiple Vitamins-Minerals (ICAPS AREDS 2 PO) Take 1 capsule by mouth 2 (two) times daily.    . Vitamin D, Ergocalciferol, (DRISDOL) 50000 UNITS CAPS Take 50,000 Units by mouth every 30 (thirty) days.     No current facility-administered medications for this visit.     Allergies:   Patient has no known allergies.    Social History:  The patient  reports that she has never smoked. She has never used smokeless tobacco. She reports that she does not drink alcohol or use drugs.   Family History:  The patient's family history includes Cancer (age of onset: 85) in her father; Stroke (age of onset: 80) in her mother.    ROS:  Please see the history of present illness.  Otherwise, review of systems are positive for lightheadedness.   All other systems are reviewed and negative.    PHYSICAL EXAM: VS:  BP (!) 156/82 (BP Location: Right Arm, Patient Position: Sitting, Cuff Size: Normal)   Pulse 72   Ht 5\' 4"  (1.626 m)   Wt 184 lb 6.4 oz (83.6 kg)   SpO2 99%   BMI 31.65 kg/m  , BMI Body mass index is 31.65 kg/m. GEN: Well nourished, well developed, in no acute distress  HEENT: normal  Neck: no JVD, carotid bruits, or masses Cardiac: RRR; soft systolic murmurs,no rubs, or gallops,no edema  Respiratory:  clear to auscultation bilaterally, normal work of breathing GI: soft, nontender, nondistended, + BS, obese, no abdominal bruits MS: no deformity or atrophy  Skin: warm and dry, no rash Neuro:  Strength and sensation are intact Psych: euthymic mood, full affect   EKG:   The ekg  ordered on 9/28 demonstrates NSR, no ST changes   Recent Labs: 04/14/2017: BUN 19; Creatinine, Ser 1.05; Hemoglobin 13.6; Platelets 228; Potassium 4.2; Sodium 137   Lipid Panel No results found for: CHOL, TRIG, HDL, CHOLHDL, VLDL, LDLCALC, LDLDIRECT   Other studies Reviewed: Additional studies/ records that were reviewed today with results demonstrating: 2018 Holter result reviewed. .   ASSESSMENT AND PLAN:  1. HTN: Doubt secondary cause of HTN.  Most likely essential hypertension.  Start Lisinopril 10 mg daily.  Check BMet.  No anginal sx.  Labs for pheo checked by Dr. Forde Dandy.  I agree with him that this is unlikely.   2. PVC: 10% of heart beats.  Not enough at this time to cause longterm issues.  Consider echo if lightheadedness persists.   3. Lightheadedness:  Likely related to sudden change in BP.  We spoke about lifestyle changes to help lower BP.  Decrease salt, increase exercise as below and try to lose weight.    Since she has symptoms, I would start the medicine and wean it off if her BP comes down.     Current medicines are reviewed at length with the patient today.  The patient concerns regarding her medicines were addressed.  The following changes have been made:  No change  Labs/ tests ordered today include:  No orders of the defined types were placed in this encounter.   Recommend 150 minutes/week of aerobic exercise Low fat, low carb, high fiber diet recommended  Disposition:   FU in 2 weeks or HTN clinic   Signed, Larae Grooms, MD  05/15/2017 2:58 PM    [facility] [address], [place], [place]  [address] Phone: [phone]; Fax: [phone]

## 2017-05-22 ENCOUNTER — Other Ambulatory Visit: Payer: Medicare Other | Admitting: *Deleted

## 2017-05-22 DIAGNOSIS — I10 Essential (primary) hypertension: Secondary | ICD-10-CM

## 2017-05-23 LAB — BASIC METABOLIC PANEL
BUN/Creatinine Ratio: 27 (ref 12–28)
BUN: 23 mg/dL (ref 8–27)
CALCIUM: 9.3 mg/dL (ref 8.7–10.3)
CO2: 21 mmol/L (ref 20–29)
CREATININE: 0.84 mg/dL (ref 0.57–1.00)
Chloride: 105 mmol/L (ref 96–106)
GFR calc Af Amer: 80 mL/min/{1.73_m2} (ref >59)
GFR calc non Af Amer: 69 mL/min/{1.73_m2} (ref >59)
GLUCOSE: 95 mg/dL (ref 65–99)
Potassium: 4.4 mmol/L (ref 3.5–5.2)
Sodium: 141 mmol/L (ref 134–144)

## 2017-05-24 ENCOUNTER — Telehealth: Payer: Self-pay | Admitting: Interventional Cardiology

## 2017-05-24 NOTE — Telephone Encounter (Signed)
Follow Up:; ° ° °Returning your call. °

## 2017-05-24 NOTE — Telephone Encounter (Signed)
-----   Message from Jettie Booze, MD sent at 05/23/2017  4:50 PM EST ----- Electrolytes normal. COntinue lisinopril.

## 2017-05-24 NOTE — Telephone Encounter (Signed)
Patient made aware of lab results and recommendations to continue lisinopril. Patient verbalizes understanding. Patient states that she has not had any episodes of lightheadedness since being seen in the office and states that her BP has been running 130-140s/60-70s.

## 2017-06-05 NOTE — Progress Notes (Signed)
Patient ID: Crystal Spears                 DOB: 1940/12/03                      MRN: [id number]     HPI: Crystal Spears is a 76 y.o. female referred by Dr. Irish Lack to HTN clinic. PMH is significant for elevated BP w/o dx of HTN, HLD, dizziness. Patient was last seen by Dr. Irish Lack on 05/15/2017 after referral from PCP for lightheadedness accompanied by reported SBP > 200. Pt wore holter monitor prior to this visit and this showed NSR with 10% PVCs. At last office visit, BP was found to be elevated at 156/82 and lisinospril 10 mg daily was started. F/u BMET on 05/22/2017 revealed reduced SCr and K wnl. Of note, pt was seen in ED for near syncope in September 2018.   Today patient presents for follow up. She denies falls, dizziness, syncope, CP. She takes lisinopril in the mornings, last took at [place] today. Reports she gets at least 6 hours of sleep nightly. States "I've never had BP problems."  Current HTN meds: lisinopril 10 mg daily Previously tried: none BP goal: <130/80 mmHg  Family History: The patient's family history includes Cancer (age of onset: 87) in her father; hemorrhagic stroke (age of onset: 30) in her mother, ischemic stroke in aunt. Grandmother with HTN.   Social History: The patient reports that she has never smoked. She has never used smokeless tobacco. She reports that she does not drink alcohol or use drugs.   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   Wt Readings from Last 3 Encounters:  06/06/17 181 lb (82.1 kg)  05/15/17 184 lb 6.4 oz (83.6 kg)  11/25/11 182 lb 12.8 oz (82.9 kg)   BP Readings from Last 3 Encounters:  06/06/17 118/72  05/15/17 (!) 156/82  04/14/17 140/78   Pulse Readings from Last 3 Encounters:  05/15/17 72  04/14/17 70  11/25/11 68    Renal function: Estimated Creatinine Clearance: 61.9 mL/min (by C-G formula based on SCr of 0.84 mg/dL).  Past Medical History:  Diagnosis Date  . Colon polyps   . Dizziness   . Elevated blood pressure reading without diagnosis of hypertension   . Hyperlipidemia   . Hyperparathyroidism, primary ([place])    S/P parathyroidectomy  . Hypothyroidism   . Near syncope   . Osteopenia   . Osteoporosis   . Pneumonia   . Seasonal allergies   . Thyroid nodule    HYPERPLASTIC    Current Outpatient Medications on File Prior to Visit  Medication Sig Dispense Refill  . lisinopril (PRINIVIL,ZESTRIL) 10 MG tablet Take 1 tablet (10 mg total) by mouth daily. 90 tablet 3  . atorvastatin (LIPITOR) 20 MG tablet Daily.    . Calcium Carbonate-Vitamin D (CALCIUM + D PO) Take by mouth 2 (two)  times daily.     Marland Kitchen loratadine (CLARITIN) 10 MG tablet Take 10 mg by mouth as needed.     . Multiple Vitamin (MULTIVITAMIN) capsule Take 1 capsule by mouth daily.    . Multiple Vitamins-Minerals (ICAPS AREDS 2 PO) Take 1 capsule by mouth 2 (two) times daily.    . Vitamin D, Ergocalciferol, (DRISDOL) 50000 UNITS CAPS Take 50,000 Units by mouth every 30 (thirty) days.     No current facility-administered medications on file prior to visit.     No Known Allergies   Assessment/Plan:  1. Hypertension - Controlled, below goal of <130/80 on office BP check. Patient reports uncontrolled readings at home, however verbally demonstrates incorrect technique for arm cuff. Significant room for improvement with dietary sodium intake. F/u BMET after lisinopril start stable, wnl.    - Continue lisinopril 10 mg daily - Pt to continue home BP monitoring - Will call in 2 weeks to assess home readings with improved monitoring technique and schedule f/u as needed  Carlean Jews, Pharm.D. PGY2 Ambulatory Care Pharmacy Resident Phone: [phone]

## 2017-06-06 ENCOUNTER — Ambulatory Visit (INDEPENDENT_AMBULATORY_CARE_PROVIDER_SITE_OTHER): Payer: Medicare Other | Admitting: Pharmacist

## 2017-06-06 DIAGNOSIS — I10 Essential (primary) hypertension: Secondary | ICD-10-CM

## 2017-06-06 NOTE — Patient Instructions (Signed)
Thanks for coming to see me today.   1. Continue taking your lisinopril 10 mg once a day 2. Your lab work looked good back  3. Keep taking your blood pressure at home and make sure the cuff is ABOVE the crook of your arm. Write down these readings.  I will call you in 2 weeks to check on your blood pressures and we can talk about scheduling follow up then if needed. My phone number is [phone].   Work on decreasing salt and processed/fast foods in your diet, try to increase your exercise to 150 minutes/week.

## 2017-06-21 ENCOUNTER — Telehealth: Payer: Self-pay | Admitting: Pharmacist

## 2017-06-21 NOTE — Telephone Encounter (Signed)
Agree with treatment plan.

## 2017-06-21 NOTE — Telephone Encounter (Signed)
Called patient to follow up on home BP readings since our last office visit. Patient reports she has improved her measuring technique (placing cuff ABOVE crook of arm) and her readings have been "good" all in the 120s-130s/70-80s per patient report. She does endorse an episode of vertigo on Sunday which made her unable to take any medication. She reports her BP was 158/76 that day and was similar on Monday BEFORE she took any lisinopril. She is concerned again today as her BP was 157/81 this morning. Patient reports she has NOT taken lisinopril yet.   Advised patient to take BP at least 2 hours after taking dose of lisinopril for the next few days.  I will follow up with her later this week.  Carlean Jews, Pharm.D., BCPS PGY2 Ambulatory Care Pharmacy Resident Phone: [phone]

## 2017-06-26 NOTE — Telephone Encounter (Signed)
Agree with plan as documented.

## 2017-06-26 NOTE — Telephone Encounter (Signed)
Called patient to follow up on conversation earlier this week. Patient reports she took her BP once since last conversation at least 2 hours after taking BP meds. Reports SBP was in the 150s but could not report exact reading. No increase in salt/fluid intake, stress, no decrease in sleep. Reports adherence to meds. Encouraged patient to make an appointment with HTN clinic to follow up. Patient verbalized understanding and expressed agreement.   Carlean Jews, Pharm.D., BCPS PGY2 Ambulatory Care Pharmacy Resident Phone: [phone]

## 2017-07-24 NOTE — Progress Notes (Signed)
Patient ID: Crystal Spears                 DOB: August 04, 1940                      MRN: [id number]     HPI: Crystal Spears is a 77 y.o. female referred by Dr. Irish Lack to HTN clinic. PMH is significant for elevated BP w/o dx of HTN, HLD, dizziness. Patient was last seen by Dr. Irish Lack on 05/15/2017 after referral from PCP for lightheadedness accompanied by reported SBP > 200. Pt wore holter monitor prior to this visit and this showed NSR with 10% PVCs. At recent office visit, BP was found to be elevated at 156/82 and lisinospril 10 mg daily was started with improved SCr and K wnl. At last HTN clinic visit, patient was found to be normotensive on lisinopril 10 mg daily and no changes were made. Since then, patient reports uncontrolled readings at home and comes in today for follow up.  Of note, pt was seen in ED for near syncope in September 2018.   Today, patient denies falls, dizziness, syncope, CP. No recurrence of vertigo symptoms per patient.  Takes lisinopril in AM. Last took meds at [facility] today. Reports consistent sleep of ~7hrs/night. Has been "conciously watching" salt and canned foods. Does have ~2 glasses of caffeinated tea daily. Consistently walking in mall x30 mins 2-3 times weekly.   Patient brings in BP cuff for calibration and log book of readings. After discussion, patient thinks that her lower BP readings are generally after she has gone walking.  Blood pressure cuff within 10 points of manual check. BP cuff = 125/72 mmHg, HR 77 BPM.   Current HTN meds: lisinopril 10 mg daily Previously tried: none BP goal: <130/80 mmHg  Family History: The patient's family history includes Cancer (age of onset: 54) in her father; hemorrhagic stroke (age of onset: 59) in her mother, ischemic stroke in aunt.Grandmother with HTN.   Social History: The patientreports that she has never smoked. She has never used smokeless tobacco. She reports that she does not drink alcohol or use  drugs.  Diet: Fast food 1-2 times weekly, cooks at home otherwise, has tried to reduce salt intake by watching what she cooks with and avoiding canned foods. Does still have chips with sandwiches on occasion.   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   Wt Readings from Last 3 Encounters:  06/06/17 181 lb (82.1 kg)  05/15/17 184 lb 6.4 oz (83.6 kg)  11/25/11 182 lb 12.8 oz (82.9 kg)   BP Readings from Last 3 Encounters:  06/06/17 118/72  05/15/17 (!) 156/82  04/14/17 140/78   Pulse Readings from Last 3 Encounters:  05/15/17 72  04/14/17 70  11/25/11 68    Renal function: CrCl cannot be calculated (Patient's most recent lab result is older than the maximum 21 days allowed.).  Past Medical History:  Diagnosis Date  . Colon polyps   . Dizziness   . Elevated blood pressure reading without diagnosis of hypertension   . Hyperlipidemia   . Hyperparathyroidism, primary ([place])    S/P parathyroidectomy  . Hypothyroidism   . Near syncope   . Osteopenia   .  Osteoporosis   . Pneumonia   . Seasonal allergies   . Thyroid nodule    HYPERPLASTIC    Current Outpatient Medications on File Prior to Visit  Medication Sig Dispense Refill  . atorvastatin (LIPITOR) 20 MG tablet Daily.    . Calcium Carbonate-Vitamin D (CALCIUM + D PO) Take by mouth 2 (two) times daily.     Marland Kitchen lisinopril (PRINIVIL,ZESTRIL) 10 MG tablet Take 1 tablet (10 mg total) by mouth daily. 90 tablet 3  . loratadine (CLARITIN) 10 MG tablet Take 10 mg by mouth as needed.     . Multiple Vitamin (MULTIVITAMIN) capsule Take 1 capsule by mouth daily.    . Multiple Vitamins-Minerals (ICAPS AREDS 2 PO) Take 1 capsule by mouth 2 (two) times daily.    . Vitamin D,  Ergocalciferol, (DRISDOL) 50000 UNITS CAPS Take 50,000 Units by mouth every 30 (thirty) days.     No current facility-administered medications on file prior to visit.     No Known Allergies   Assessment/Plan:  1. Hypertension - Blood pressure controlled in clinic, below goal of <130/80 mmHg. Tolerating lisinopril well. Patient's log with some uncontrolled readings at home, however patient thinks that her readings are more controlled on days she exercises. Home blood pressure technique appropriate and cuff reasonably accurate. Patient has made efforts to improve dietary sodium intake. Continued lisinopril 10 mg daily and counseled on more regular exercise and asked to keep note of days she exercises in BP log. Patient to follow up with cardiologist as usual.   Carlean Jews, Pharm.D., BCPS PGY2 Ambulatory Care Pharmacy Resident Phone: [phone]

## 2017-07-25 ENCOUNTER — Ambulatory Visit (INDEPENDENT_AMBULATORY_CARE_PROVIDER_SITE_OTHER): Payer: Medicare Other | Admitting: Pharmacist

## 2017-07-25 VITALS — BP 118/70 | HR 60

## 2017-07-25 DIAGNOSIS — I10 Essential (primary) hypertension: Secondary | ICD-10-CM

## 2017-07-25 NOTE — Patient Instructions (Addendum)
Thanks for coming to see me!   1. No changes to medications, keep taking lisinopril 10 mg daily.   2. Call us if your blood pressure readings are consistently above 130/80 mmHg.   3. Keep track of the days you're exercising and see if there is a true correlation.   Please follow up with your cardiologist as usual.

## 2018-07-26 ENCOUNTER — Other Ambulatory Visit: Payer: Self-pay | Admitting: Obstetrics and Gynecology

## 2018-07-26 DIAGNOSIS — Z1231 Encounter for screening mammogram for malignant neoplasm of breast: Secondary | ICD-10-CM

## 2018-08-22 DIAGNOSIS — Z0000 Encounter for general adult medical examination without abnormal findings: Secondary | ICD-10-CM | POA: Insufficient documentation

## 2018-08-24 ENCOUNTER — Ambulatory Visit
Admission: RE | Admit: 2018-08-24 | Discharge: 2018-08-24 | Disposition: A | Discharge status: Home / Self Care | Payer: Medicare Other | Source: Ambulatory Visit | Attending: Obstetrics and Gynecology | Admitting: Obstetrics and Gynecology

## 2018-08-24 DIAGNOSIS — Z1231 Encounter for screening mammogram for malignant neoplasm of breast: Secondary | ICD-10-CM

## 2018-08-27 DIAGNOSIS — H353290 Exudative age-related macular degeneration, unspecified eye, stage unspecified: Secondary | ICD-10-CM | POA: Insufficient documentation

## 2018-08-27 DIAGNOSIS — I493 Ventricular premature depolarization: Secondary | ICD-10-CM | POA: Insufficient documentation

## 2019-02-03 DIAGNOSIS — N390 Urinary tract infection, site not specified: Secondary | ICD-10-CM | POA: Insufficient documentation

## 2019-08-30 ENCOUNTER — Other Ambulatory Visit: Payer: Self-pay | Admitting: Obstetrics and Gynecology

## 2019-08-30 DIAGNOSIS — Z1231 Encounter for screening mammogram for malignant neoplasm of breast: Secondary | ICD-10-CM

## 2019-09-02 ENCOUNTER — Ambulatory Visit
Admission: RE | Admit: 2019-09-02 | Discharge: 2019-09-02 | Disposition: A | Discharge status: Home / Self Care | Payer: Medicare Other | Source: Ambulatory Visit | Attending: Obstetrics and Gynecology | Admitting: Obstetrics and Gynecology

## 2019-09-02 ENCOUNTER — Other Ambulatory Visit: Payer: Self-pay

## 2019-09-02 DIAGNOSIS — Z1231 Encounter for screening mammogram for malignant neoplasm of breast: Secondary | ICD-10-CM

## 2019-09-03 DIAGNOSIS — N1831 Chronic kidney disease, stage 3a: Secondary | ICD-10-CM | POA: Insufficient documentation

## 2019-09-03 DIAGNOSIS — E669 Obesity, unspecified: Secondary | ICD-10-CM | POA: Insufficient documentation

## 2019-09-03 DIAGNOSIS — U071 COVID-19: Secondary | ICD-10-CM | POA: Insufficient documentation

## 2020-04-06 NOTE — Progress Notes (Signed)
Cardiology Office Note   Date:  04/07/2020   ID:  Crystal Spears, Crystal Spears 01/14/1941, MRN [id number]  PCP:  Reynold Bowen, MD    No chief complaint on file.  HTN  Wt Readings from Last 3 Encounters:  04/07/20 178 lb 3.2 oz (80.8 kg)  06/06/17 181 lb (82.1 kg)  05/15/17 184 lb 6.4 oz (83.6 kg)       History of Present Illness: Crystal Spears is a 79 y.o. female who I first saw in 2018 for lightheadedness.  In 2018, she had an episode of lightheadedness and was close to a fire station. Her systolic blood pressure was greater than 200 mmHg. She had a negative work-up in the hospital.  She subsequently wore a Holter monitor and had some blood work that was negative.  Holter showed NSR, about 10 % PVCs.  She did not have significant sx with the monitor.  She was checked for pheochromocytoma by Dr. Forde Dandy. Lisinopril was started. Plan also included: "We spoke about lifestyle changes to help lower BP.  Decrease salt, increase exercise as below and try to lose weight."  Since the last visit, BP has been well controlled.   Denies : Chest pain. Dizziness. Leg edema. Nitroglycerin use. Orthopnea. Palpitations. Paroxysmal nocturnal dyspnea. Shortness of breath. Syncope.   Home readings are usually in the normal on her home cuff; which gives a reading of (low, normal or high).  Rare low and high readings.  By sitting down and deep breathing, High readings will come down.  Not like when she had high readings in 2018.  Walks regularly; more when COVID lockdowns were more prevalent.  COVID vaccines received in late April 2021.  Had [place] in Jan 2021.    Past Medical History:  Diagnosis Date   Colon polyps    Dizziness    Elevated blood pressure reading without diagnosis of hypertension    Hyperlipidemia    Hyperparathyroidism, primary ([place])    S/P parathyroidectomy   Hypothyroidism    Near syncope    Osteopenia    Osteoporosis    Pneumonia    Seasonal allergies     Thyroid nodule    HYPERPLASTIC    Past Surgical History:  Procedure Laterality Date   ABDOMINAL HYSTERECTOMY  1988   BREAST DUCTAL SYSTEM EXCISION  11/16/2011   Procedure: EXCISION DUCTAL SYSTEM BREAST;  Surgeon: Joyice Faster. Cornett, MD;  Location: [place];  Service: General;  Laterality: Left;  left breast central duct excision   CATARACT EXTRACTION, BILATERAL Bilateral 2013   CESAREAN SECTION  1972, 1978   parathyroid gland removal  2000   approximation of date   PARATHYROIDECTOMY       Current Outpatient Medications  Medication Sig Dispense Refill   atorvastatin (LIPITOR) 20 MG tablet Take 20 mg by mouth daily at 6 PM.      Calcium Carbonate-Vitamin D (CALCIUM + D PO) Take by mouth 2 (two) times daily.      lisinopril (PRINIVIL,ZESTRIL) 10 MG tablet Take 1 tablet (10 mg total) by mouth daily. 90 tablet 3   loratadine (CLARITIN) 10 MG tablet Take 10 mg by mouth as needed.      Multiple Vitamins-Minerals (ICAPS AREDS 2 PO) Take 1 capsule by mouth 2 (two) times daily.     Vitamin D, Ergocalciferol, (DRISDOL) 50000 UNITS CAPS Take 50,000 Units by mouth every 7 (seven) days.      No current facility-administered medications for this visit.  Allergies:   Patient has no known allergies.    Social History:  The patient  reports that she has never smoked. She has never used smokeless tobacco. She reports that she does not drink alcohol and does not use drugs.   Family History:  The patient's family history includes Cancer (age of onset: 73) in her father; Stroke (age of onset: 80) in her mother.    ROS:  Please see the history of present illness.   Otherwise, review of systems are positive for less exercise in the hot weather.   All other systems are reviewed and negative.    PHYSICAL EXAM: VS:  BP 120/60    Pulse 78    Ht 5\' 4"  (1.626 m)    Wt 178 lb 3.2 oz (80.8 kg)    SpO2 97%    BMI 30.59 kg/m  , BMI Body mass index is 30.59 kg/m. GEN: Well  nourished, well developed, in no acute distress  HEENT: normal  Neck: no JVD, carotid bruits, or masses Cardiac: RRR, premature beats; no murmurs, rubs, or gallops,no edema  Respiratory:  clear to auscultation bilaterally, normal work of breathing GI: soft, nontender, nondistended, + BS MS: no deformity or atrophy  Skin: warm and dry, no rash Neuro:  Strength and sensation are intact Psych: euthymic mood, full affect   EKG:   The ekg ordered today demonstrates NSR, PVCs   Recent Labs: No results found for requested labs within last 8760 hours.   Lipid Panel No results found for: CHOL, TRIG, HDL, CHOLHDL, VLDL, LDLCALC, LDLDIRECT   Other studies Reviewed: Additional studies/ records that were reviewed today with results demonstrating: LDL 69 in 2/21 with Dr. Forde Dandy..   ASSESSMENT AND PLAN:  1. HTN: The current medical regimen is effective;  continue present plan and medications.  Tolerating lisinopril.   2. PVCs: Resolved. No sx of palpitations.  Still noted on ECG.  10% PVCs on the last monitor.  Will not recheck at this time given her lack of symptoms.  3. Lightheadedness: Resolved. 4. Whole food, plant based diet recommended. 5. Hyperlipidemia: Continue atorvastatin.  Well controlled.  Increase walking to target below.   Current medicines are reviewed at length with the patient today.  The patient concerns regarding her medicines were addressed.  The following changes have been made:  No change  Labs/ tests ordered today include:  No orders of the defined types were placed in this encounter.   Recommend 150 minutes/week of aerobic exercise Low fat, low carb, high fiber diet recommended  Disposition:   FU in 2-3 years   Signed, Larae Grooms, MD  04/07/2020 9:29 AM    [facility] [address], [place], [place]  [address] Phone: [phone]; Fax: [phone]

## 2020-04-07 ENCOUNTER — Encounter: Payer: Self-pay | Admitting: Interventional Cardiology

## 2020-04-07 ENCOUNTER — Ambulatory Visit: Payer: Medicare Other | Admitting: Interventional Cardiology

## 2020-04-07 ENCOUNTER — Other Ambulatory Visit: Payer: Self-pay

## 2020-04-07 VITALS — BP 120/60 | HR 78 | Ht 64.0 in | Wt 178.2 lb

## 2020-04-07 DIAGNOSIS — E782 Mixed hyperlipidemia: Secondary | ICD-10-CM

## 2020-04-07 DIAGNOSIS — I10 Essential (primary) hypertension: Secondary | ICD-10-CM

## 2020-04-07 DIAGNOSIS — R42 Dizziness and giddiness: Secondary | ICD-10-CM

## 2020-04-07 DIAGNOSIS — I493 Ventricular premature depolarization: Secondary | ICD-10-CM

## 2020-04-07 NOTE — Patient Instructions (Signed)
Medication Instructions:  Your physician recommends that you continue on your current medications as directed. Please refer to the Current Medication list given to you today.  *If you need a refill on your cardiac medications before your next appointment, please call your pharmacy*   Lab Work: None  If you have labs (blood work) drawn today and your tests are completely normal, you will receive your results only by: Marland Kitchen MyChart Message (if you have MyChart) OR . A paper copy in the mail If you have any lab test that is abnormal or we need to change your treatment, we will call you to review the results.   Testing/Procedures: None   Follow-Up: At [facility], you and your health needs are our priority.  As part of our continuing mission to provide you with exceptional heart care, we have created designated Provider Care Teams.  These Care Teams include your primary Cardiologist (physician) and Advanced Practice Providers (APPs -  Physician Assistants and Nurse Practitioners) who all work together to provide you with the care you need, when you need it.  We recommend signing up for the patient portal called "MyChart".  Sign up information is provided on this After Visit Summary.  MyChart is used to connect with patients for Virtual Visits (Telemedicine).  Patients are able to view lab/test results, encounter notes, upcoming appointments, etc.  Non-urgent messages can be sent to your provider as well.   To learn more about what you can do with MyChart, go to [email or web address].    Your next appointment:   3 year(s)  The format for your next appointment:   In Person  Provider:   You may see Casandra Doffing, MD or one of the following Advanced Practice Providers on your designated Care Team:    Melina Copa, PA-C  Ermalinda Barrios, PA-C    Other Instructions None

## 2020-07-25 ENCOUNTER — Ambulatory Visit: Payer: Medicare Other | Attending: Internal Medicine

## 2020-07-25 DIAGNOSIS — Z23 Encounter for immunization: Secondary | ICD-10-CM

## 2020-07-25 NOTE — Progress Notes (Signed)
   Covid-19 Vaccination Clinic  Name:  Crystal Spears    MRN: [id number] DOB: 1941/06/02  07/25/2020  [name] was observed post Covid-19 immunization for 15 minutes without incident. She was provided with Vaccine Information Sheet and instruction to access the V-Safe system.   [name] was instructed to call 911 with any severe reactions post vaccine: Marland Kitchen Difficulty breathing  . Swelling of face and throat  . A fast heartbeat  . A bad rash all over body  . Dizziness and weakness   Immunizations Administered    Name Date Dose VIS Date Route   Pfizer COVID-19 Vaccine 07/25/2020 11:42 AM 0.3 mL 05/06/2020 Intramuscular   Manufacturer: [place]   Lot: [id number]   [place]: [id number]

## 2020-07-28 ENCOUNTER — Other Ambulatory Visit: Payer: Self-pay | Admitting: Obstetrics and Gynecology

## 2020-07-28 DIAGNOSIS — Z1231 Encounter for screening mammogram for malignant neoplasm of breast: Secondary | ICD-10-CM

## 2020-09-08 ENCOUNTER — Ambulatory Visit
Admission: RE | Admit: 2020-09-08 | Discharge: 2020-09-08 | Disposition: A | Discharge status: Home / Self Care | Payer: Medicare Other | Source: Ambulatory Visit | Attending: Obstetrics and Gynecology | Admitting: Obstetrics and Gynecology

## 2020-09-08 ENCOUNTER — Other Ambulatory Visit: Payer: Self-pay

## 2020-09-08 DIAGNOSIS — Z1231 Encounter for screening mammogram for malignant neoplasm of breast: Secondary | ICD-10-CM

## 2020-10-06 ENCOUNTER — Telehealth: Payer: Self-pay | Admitting: Podiatry

## 2020-10-06 NOTE — Telephone Encounter (Signed)
Patient called inquiring about appointment from [facility] Referral, patient was unaware that she is scheduled for 10/15/2020 at 130pm, but patient was informed before call ended

## 2020-10-15 ENCOUNTER — Encounter: Payer: Self-pay | Admitting: Podiatry

## 2020-10-15 ENCOUNTER — Ambulatory Visit: Payer: Medicare Other

## 2020-10-15 ENCOUNTER — Ambulatory Visit: Payer: Medicare Other | Admitting: Podiatry

## 2020-10-15 ENCOUNTER — Other Ambulatory Visit: Payer: Self-pay

## 2020-10-15 DIAGNOSIS — M7752 Other enthesopathy of left foot: Secondary | ICD-10-CM | POA: Diagnosis not present

## 2020-10-15 DIAGNOSIS — M2042 Other hammer toe(s) (acquired), left foot: Secondary | ICD-10-CM

## 2020-10-15 DIAGNOSIS — L600 Ingrowing nail: Secondary | ICD-10-CM

## 2020-10-15 DIAGNOSIS — N63 Unspecified lump in unspecified breast: Secondary | ICD-10-CM | POA: Insufficient documentation

## 2020-10-15 DIAGNOSIS — D2372 Other benign neoplasm of skin of left lower limb, including hip: Secondary | ICD-10-CM

## 2020-10-15 DIAGNOSIS — I779 Disorder of arteries and arterioles, unspecified: Secondary | ICD-10-CM | POA: Insufficient documentation

## 2020-10-15 MED ORDER — DEXAMETHASONE SODIUM PHOSPHATE 120 MG/30ML IJ SOLN
2.0000 mg | Freq: Once | INTRAMUSCULAR | Status: AC
  Administered 2020-10-15: 2 mg via INTRA_ARTICULAR

## 2020-10-15 MED ORDER — NEOMYCIN-POLYMYXIN-HC 1 % OT SOLN: OTIC | 1 refills | Status: DC

## 2020-10-15 NOTE — Patient Instructions (Signed)

## 2020-10-15 NOTE — Progress Notes (Signed)
Subjective:  Patient ID: Crystal Spears, female    DOB: 07-Dec-1940,  MRN: [id number] HPI Chief Complaint  Patient presents with  . Toe Pain    5th toe left - red, tender, callused x few months, Dr. Noemi Chapel xrayed said had a spur   . Ingrown Toenail    Hallux bilateral - RIGHT-lateral border, LEFT-medial border, possible ingrowns   . New Patient (Initial Visit)    80 y.o. female presents with the above complaint.   ROS: Denies fever chills nausea vomiting muscle aches pains calf pain back pain chest pain shortness of breath.  Past Medical History:  Diagnosis Date  . Colon polyps   . Dizziness   . Elevated blood pressure reading without diagnosis of hypertension   . Hyperlipidemia   . Hyperparathyroidism, primary ([place])    S/P parathyroidectomy  . Hypothyroidism   . Near syncope   . Osteopenia   . Osteoporosis   . Pneumonia   . Seasonal allergies   . Thyroid nodule    HYPERPLASTIC   Past Surgical History:  Procedure Laterality Date  . ABDOMINAL HYSTERECTOMY  1988  . BREAST DUCTAL SYSTEM EXCISION  11/16/2011   Procedure: EXCISION DUCTAL SYSTEM BREAST;  Surgeon: Joyice Faster. Cornett, MD;  Location: [facility];  Service: General;  Laterality: Left;  left breast central duct excision  . CATARACT EXTRACTION, BILATERAL Bilateral 2013  . [place]  . parathyroid gland removal  2000   approximation of date  . PARATHYROIDECTOMY      Current Outpatient Medications:  .  NEOMYCIN-POLYMYXIN-HYDROCORTISONE (CORTISPORIN) 1 % SOLN OTIC solution, Apply 1-2 drops to toe BID after soaking, Disp: 10 mL, Rfl: 1 .  atorvastatin (LIPITOR) 20 MG tablet, Take 20 mg by mouth daily at 6 PM. , Disp: , Rfl:  .  Calcium Carbonate-Vitamin D (CALCIUM + D PO), Take by mouth 2 (two) times daily. , Disp: , Rfl:  .  diclofenac (CATAFLAM) 50 MG tablet, , Disp: , Rfl:  .  lisinopril (PRINIVIL,ZESTRIL) 10 MG tablet, Take 1 tablet (10 mg total) by mouth daily., Disp: 90 tablet,  Rfl: 3 .  loratadine (CLARITIN) 10 MG tablet, Take 10 mg by mouth as needed. , Disp: , Rfl:  .  Multiple Vitamins-Minerals (ICAPS AREDS 2 PO), Take 1 capsule by mouth 2 (two) times daily., Disp: , Rfl:  .  Vitamin D, Ergocalciferol, (DRISDOL) 50000 UNITS CAPS, Take 50,000 Units by mouth every 7 (seven) days. , Disp: , Rfl:   No Known Allergies Review of Systems Objective:  There were no vitals filed for this visit.  General: Well developed, nourished, in no acute distress, alert and oriented x3   Dermatological: Skin is warm, dry and supple bilateral. Nails x 10 are well maintained; remaining integument appears unremarkable at this time. There are no open sores, no preulcerative lesions, no rash or signs of infection present.  Vascular: Dorsalis Pedis artery and Posterior Tibial artery pedal pulses are 2/4 bilateral with immedate capillary fill time. Pedal hair growth present. No varicosities and no lower extremity edema present bilateral.   Neruologic: Grossly intact via light touch bilateral. Vibratory intact via tuning fork bilateral. Protective threshold with Semmes Wienstein monofilament intact to all pedal sites bilateral. Patellar and Achilles deep tendon reflexes 2+ bilateral. No Babinski or clonus noted bilateral.   Musculoskeletal: No gross boney pedal deformities bilateral. No pain, crepitus, or limitation noted with foot and ankle range of motion bilateral. Muscular strength 5/5 in all groups  tested bilateral.  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  This area does also demonstrate out fluctuance with within the soft tissues consistent with a bursitis.  Moderately tender on palpation.  Gait: Unassisted, Nonantalgic.    Radiographs:  Radiographs taken or reviewed today demonstrate an adductovarus rotated hammertoe deformity with a dorsal lateral spur at the level of the head of the proximal phalanx fifth toe left foot.  Adductovarus  rotated hammertoe was noted otherwise the x-rays are limited not demonstrating any other fractures.  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  Ingrown toenail fibular border hallux right tibial border hallux left.  Plan: Chemical matrixectomy's were performed today to the fibular border of the hallux right tibial border of the hallux left.  She tolerated this well after local anesthetic was administered.  She was provided with both oral and written home-going instructions for care and soaking of the toe.  She was also received a prescription for Corticosporin otic to be applied twice daily after soaking.  Also injected the bursa to the fifth toe today.  I debrided the area of reactive hyperkeratosis for her.  Placed a Band-Aid discussed the need for derotational arthroplasty fifth digit left foot she understands amenable to it.  She like to consider that if not improved next visit.  I will follow-up with her in 2 weeks to reevaluate the fifth toe and the nails.     Nonnie Pickney T. [place], [place]

## 2020-10-27 ENCOUNTER — Other Ambulatory Visit: Payer: Self-pay

## 2020-10-27 ENCOUNTER — Ambulatory Visit (INDEPENDENT_AMBULATORY_CARE_PROVIDER_SITE_OTHER): Payer: Medicare Other | Admitting: Podiatry

## 2020-10-27 ENCOUNTER — Encounter: Payer: Self-pay | Admitting: Podiatry

## 2020-10-27 ENCOUNTER — Telehealth: Payer: Self-pay | Admitting: *Deleted

## 2020-10-27 DIAGNOSIS — L600 Ingrowing nail: Secondary | ICD-10-CM

## 2020-10-27 DIAGNOSIS — Z9889 Other specified postprocedural states: Secondary | ICD-10-CM

## 2020-10-27 NOTE — Progress Notes (Signed)
She presents today for follow-up of her matrixectomy's hallux bilateral states that she has been soaking in Betadine which is obvious but calligraphy.  She also states that her fifth digit of her left foot is no longer painful at all.  Objective: Vital signs are stable she is alert and oriented x3.  Pulses are palpable.  Matricectomy sites demonstrate no erythema cellulitis drainage odor to some slight maceration from Band-Aids.  Fifth toe does not demonstrate any pain at all.  Assessment: Well-healing capsulitis bursitis fifth digit left well-healing matrixectomy's hallux bilateral.  Plan: Encouraged her to continue to soak Epson salts and warm water every other day cover during the daytime but leave open at bedtime.  She will follow-up with me as needed.  She will continue to soak until nontender.

## 2020-10-27 NOTE — Telephone Encounter (Signed)
Patient is wanting to know if she should continue to use the drops after soaking toe? She forgot to ask after visit.today.Please advise.

## 2020-10-27 NOTE — Telephone Encounter (Signed)
Returned call to patient and left Vmessage per Dr Milinda Pointer that she is to continue to use the drops.

## 2020-11-23 ENCOUNTER — Other Ambulatory Visit (HOSPITAL_COMMUNITY): Payer: Self-pay | Admitting: Endocrinology

## 2020-11-23 ENCOUNTER — Ambulatory Visit (HOSPITAL_COMMUNITY)
Admission: RE | Admit: 2020-11-23 | Discharge: 2020-11-23 | Disposition: A | Discharge status: Home / Self Care | Payer: Medicare Other | Source: Ambulatory Visit | Attending: Internal Medicine | Admitting: Internal Medicine

## 2020-11-23 ENCOUNTER — Other Ambulatory Visit: Payer: Self-pay

## 2020-11-23 DIAGNOSIS — M7989 Other specified soft tissue disorders: Secondary | ICD-10-CM

## 2020-11-23 DIAGNOSIS — M79604 Pain in right leg: Secondary | ICD-10-CM | POA: Diagnosis present

## 2020-12-22 ENCOUNTER — Telehealth: Payer: Self-pay | Admitting: Podiatry

## 2020-12-22 NOTE — Telephone Encounter (Signed)
Patient is calling to request a call back for advice.m Patient states he previously had 2 toenails removed, one which healed back fine, and the other is causing her issues. Patient left voicemail on nurse line but has not heard back. Please advise.

## 2020-12-22 NOTE — Telephone Encounter (Signed)
(  see pervious TE) Please advise if you need for patient to come in to be seen

## 2020-12-22 NOTE — Telephone Encounter (Signed)
If patient is still having problems with her toenail not healing, probably should get her with someone soon, it can be with any doc to check it.

## 2020-12-23 NOTE — Telephone Encounter (Signed)
Patient stated she wants to speak with someone before coming in to see if she can do something on her own. Please give patient a call back as she requested.

## 2021-08-10 ENCOUNTER — Encounter: Payer: Self-pay | Admitting: Podiatry

## 2021-08-10 ENCOUNTER — Other Ambulatory Visit: Payer: Self-pay

## 2021-08-10 ENCOUNTER — Ambulatory Visit: Payer: Medicare Other | Admitting: Podiatry

## 2021-08-10 DIAGNOSIS — D2372 Other benign neoplasm of skin of left lower limb, including hip: Secondary | ICD-10-CM

## 2021-08-10 DIAGNOSIS — M79609 Pain in unspecified limb: Secondary | ICD-10-CM | POA: Insufficient documentation

## 2021-08-10 DIAGNOSIS — M7752 Other enthesopathy of left foot: Secondary | ICD-10-CM | POA: Diagnosis not present

## 2021-08-10 MED ORDER — DEXAMETHASONE SODIUM PHOSPHATE 120 MG/30ML IJ SOLN
2.0000 mg | Freq: Once | INTRAMUSCULAR | Status: AC
  Administered 2021-08-10: 11:00:00 2 mg via INTRA_ARTICULAR

## 2021-08-10 NOTE — Progress Notes (Signed)
She presents today states that she would like to have another injection if she could to help with her toe again.  Objective: Vital signs are stable alert oriented x3 adductovarus rotated hammertoe deformity of the left foot with overlying bursa.  Multiple benign skin lesions.  Assessment: Benign skin lesions adductovarus rotated hammertoe deformity fifth left with bursitis overlying PIPJ.  Plan: Injected this area today with 2 mg dexamethasone local anesthetic debrided all reactive hyperkeratoses and I will follow-up with her as needed.

## 2021-08-13 ENCOUNTER — Other Ambulatory Visit: Payer: Self-pay | Admitting: Obstetrics and Gynecology

## 2021-08-13 ENCOUNTER — Other Ambulatory Visit: Payer: Self-pay | Admitting: Endocrinology

## 2021-08-13 DIAGNOSIS — Z1231 Encounter for screening mammogram for malignant neoplasm of breast: Secondary | ICD-10-CM

## 2021-09-16 ENCOUNTER — Ambulatory Visit
Admission: RE | Admit: 2021-09-16 | Discharge: 2021-09-16 | Disposition: A | Discharge status: Home / Self Care | Payer: Medicare Other | Source: Ambulatory Visit | Attending: Endocrinology | Admitting: Endocrinology

## 2021-09-16 DIAGNOSIS — Z1231 Encounter for screening mammogram for malignant neoplasm of breast: Secondary | ICD-10-CM

## 2022-08-08 ENCOUNTER — Other Ambulatory Visit: Payer: Self-pay | Admitting: Endocrinology

## 2022-08-08 DIAGNOSIS — Z1231 Encounter for screening mammogram for malignant neoplasm of breast: Secondary | ICD-10-CM

## 2022-09-26 ENCOUNTER — Ambulatory Visit
Admission: RE | Admit: 2022-09-26 | Discharge: 2022-09-26 | Disposition: A | Discharge status: Home / Self Care | Payer: Medicare Other | Source: Ambulatory Visit | Attending: Endocrinology | Admitting: Endocrinology

## 2022-09-26 DIAGNOSIS — Z1231 Encounter for screening mammogram for malignant neoplasm of breast: Secondary | ICD-10-CM

## 2022-11-06 IMAGING — MG MM DIGITAL SCREENING BILAT W/ TOMO AND CAD
8 series · 8 of 24 positions shown · non-contrast
Comparison: Previous exam(s).

CLINICAL DATA: Screening.

EXAM:
DIGITAL SCREENING BILATERAL MAMMOGRAM WITH TOMOSYNTHESIS AND CAD
TECHNIQUE: Bilateral screening digital craniocaudal and mediolateral oblique
mammograms were obtained. Bilateral screening digital breast
tomosynthesis was performed. The images were evaluated with
computer-aided detection.

[R CC synth-2D]
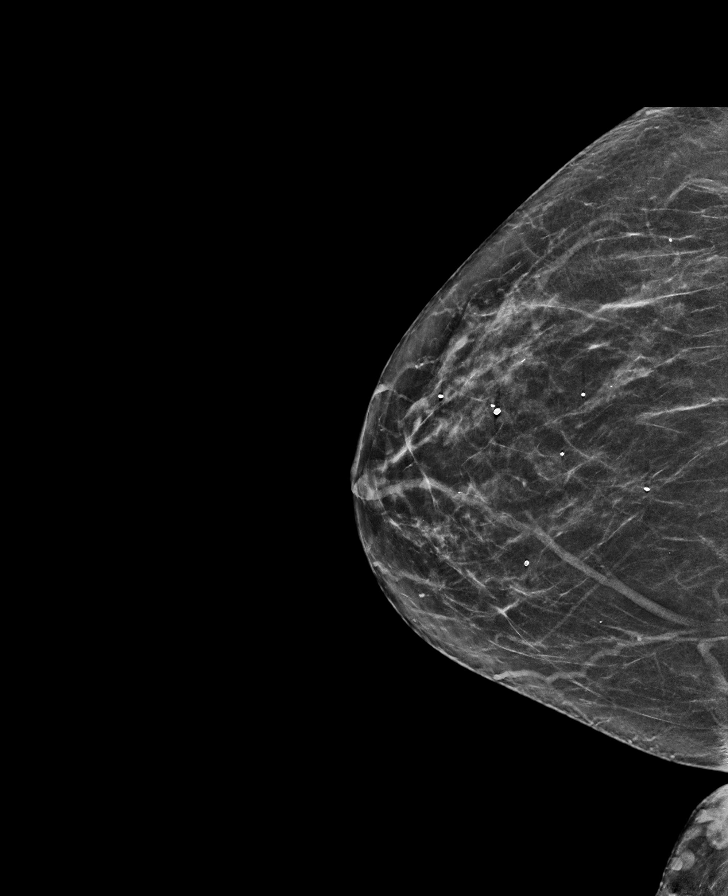

[L CC synth-2D]
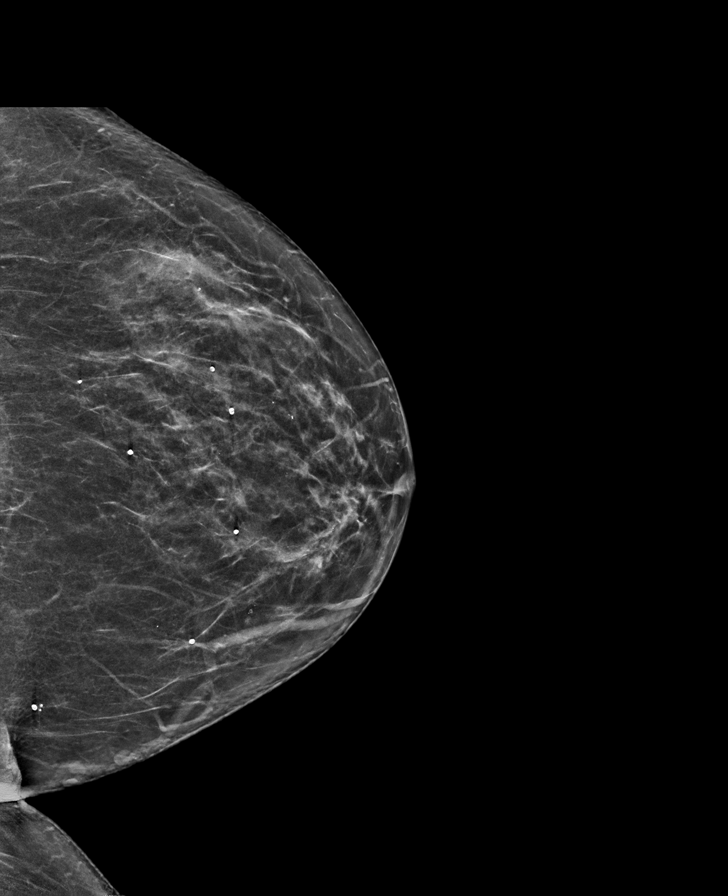

[R MLO synth-2D]
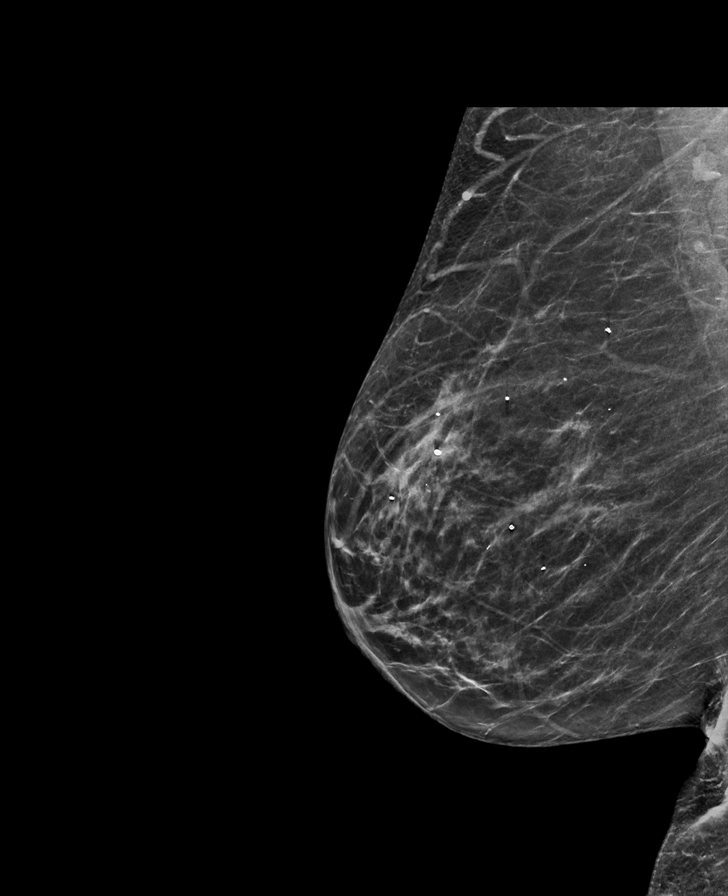

[L MLO synth-2D]
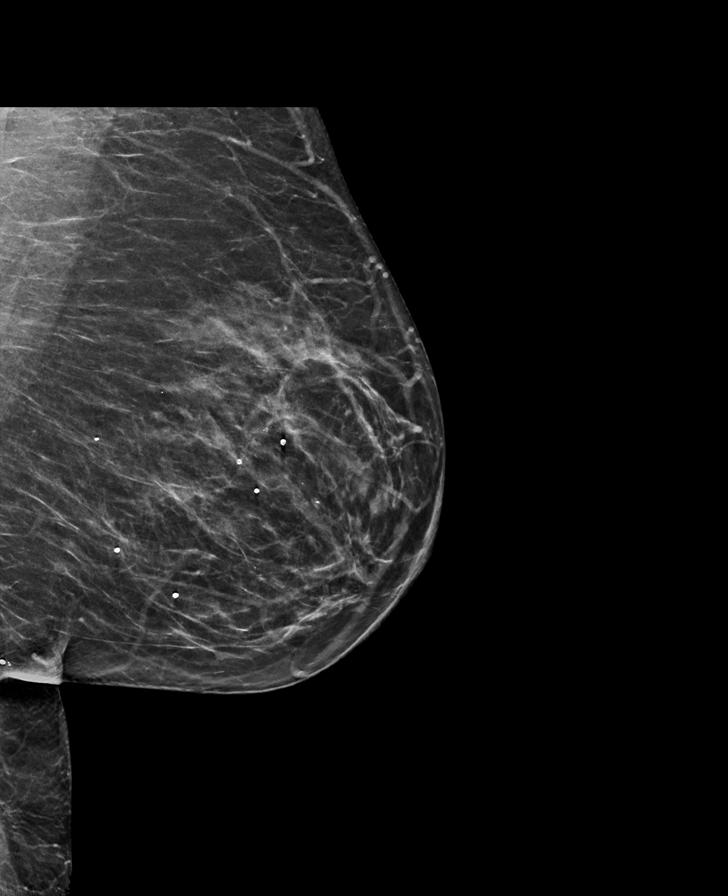

[L CC tomo · tomo slice 33/64.0]
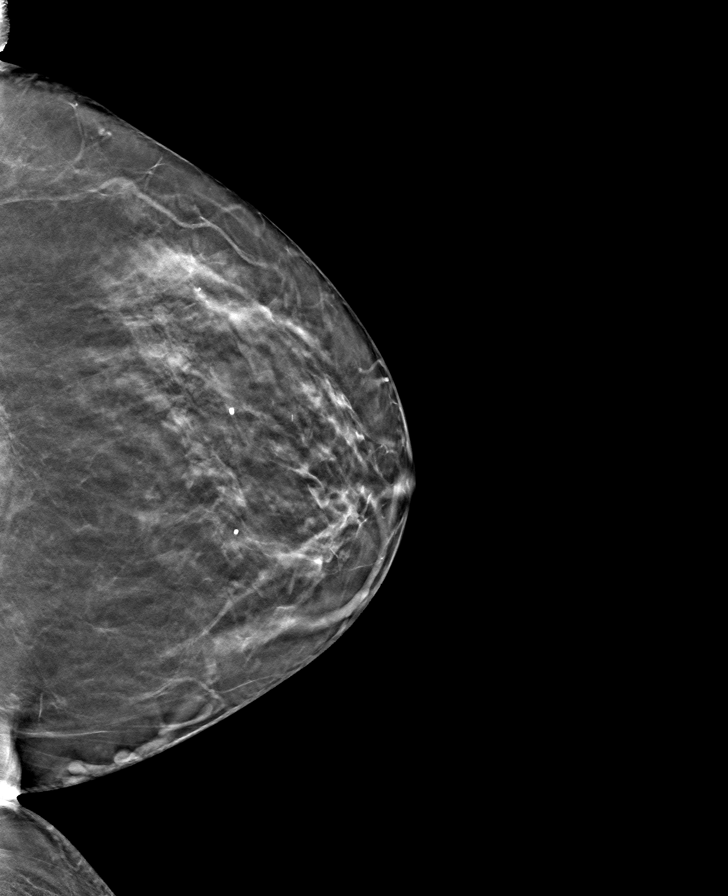

[L MLO tomo · tomo slice 33/64.0]
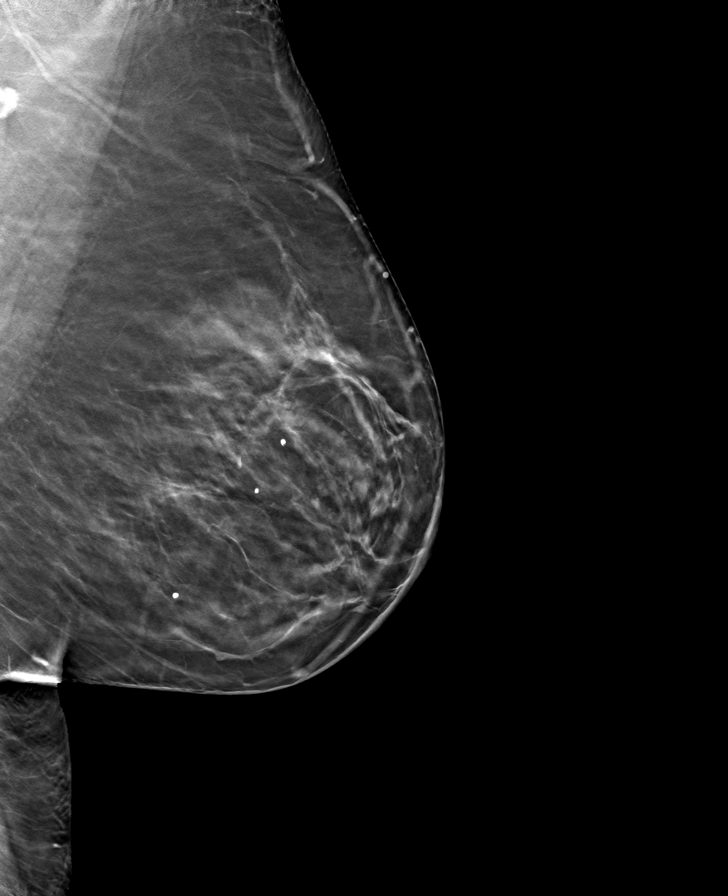

[R MLO tomo · tomo slice 33/65.0]
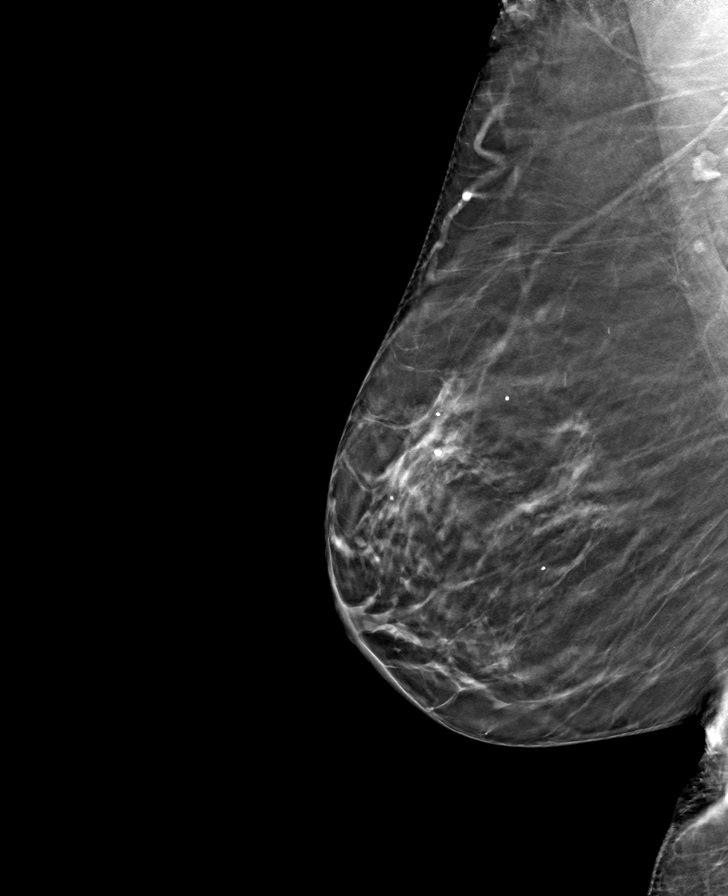

[R CC tomo · tomo slice 33/64.0]
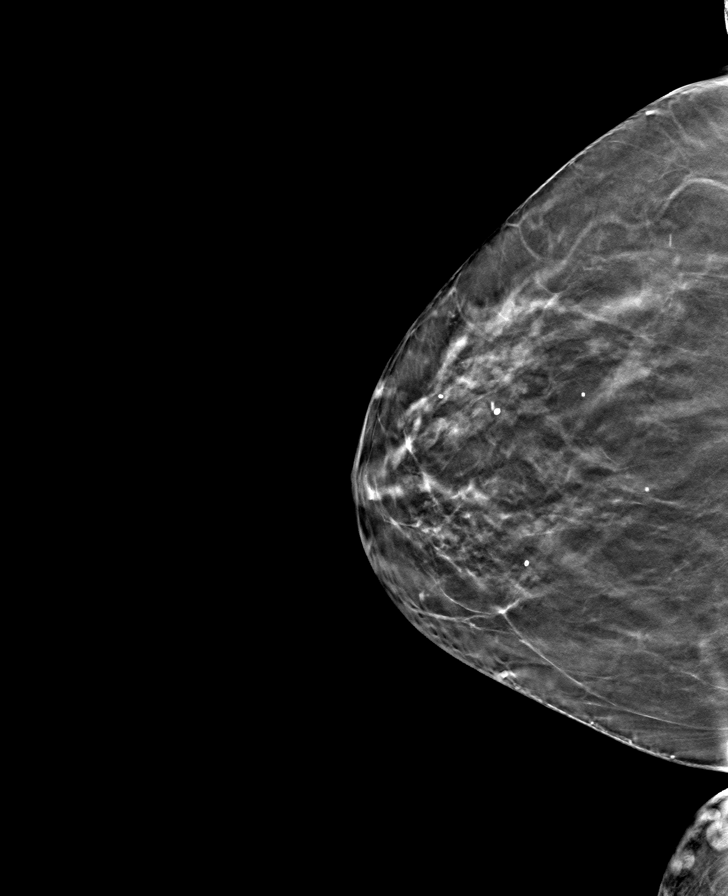

[8 of 24 positions shown; findings below may reference images not displayed]

ACR Breast Density Category b: There are scattered areas of
fibroglandular density.
FINDINGS: There are no findings suspicious for malignancy.
IMPRESSION: No mammographic evidence of malignancy. A result letter of this
screening mammogram will be mailed directly to the patient.

RECOMMENDATION:
Screening mammogram in one year. (Code:51-O-LD2)

BI-RADS CATEGORY  1: Negative.

## 2023-03-19 HISTORY — PX: ARTHROPLASTY: SHX135

## 2023-05-09 ENCOUNTER — Ambulatory Visit: Payer: Medicare Other | Admitting: Interventional Cardiology

## 2023-06-01 ENCOUNTER — Ambulatory Visit: Payer: Medicare Other | Attending: Interventional Cardiology | Admitting: Internal Medicine

## 2023-06-01 ENCOUNTER — Encounter: Payer: Self-pay | Admitting: Internal Medicine

## 2023-06-01 VITALS — BP 142/60 | HR 71 | Ht 63.0 in | Wt 172.8 lb

## 2023-06-01 DIAGNOSIS — I493 Ventricular premature depolarization: Secondary | ICD-10-CM

## 2023-06-01 DIAGNOSIS — E782 Mixed hyperlipidemia: Secondary | ICD-10-CM | POA: Diagnosis not present

## 2023-06-01 DIAGNOSIS — I10 Essential (primary) hypertension: Secondary | ICD-10-CM | POA: Diagnosis not present

## 2023-06-01 NOTE — Patient Instructions (Signed)
Medication Instructions:  Your physician recommends that you continue on your current medications as directed. Please refer to the Current Medication list given to you today.  *If you need a refill on your cardiac medications before your next appointment, please call your pharmacy*   Lab Work: LDL Direct, LPA, BMP  If you have labs (blood work) drawn today and your tests are completely normal, you will receive your results only by: MyChart Message (if you have MyChart) OR A paper copy in the mail If you have any lab test that is abnormal or we need to change your treatment, we will call you to review the results.   Testing/Procedures: Your physician has requested that you have an echocardiogram. Echocardiography is a painless test that uses sound waves to create images of your heart. It provides your doctor with information about the size and shape of your heart and how well your heart's chambers and valves are working. This procedure takes approximately one hour. There are no restrictions for this procedure. Please do NOT wear cologne, perfume, aftershave, or lotions (deodorant is allowed). Please arrive 15 minutes prior to your appointment time.  Please note: We ask at that you not bring children with you during ultrasound (echo/ vascular) testing. Due to room size and safety concerns, children are not allowed in the ultrasound rooms during exams. Our front office staff cannot provide observation of children in our lobby area while testing is being conducted. An adult accompanying a patient to their appointment will only be allowed in the ultrasound room at the discretion of the ultrasound technician under special circumstances. We apologize for any inconvenience.    Follow-Up: At [facility], you and your health needs are our priority.  As part of our continuing mission to provide you with exceptional heart care, we have created designated Provider Care Teams.  These Care Teams  include your primary Cardiologist (physician) and Advanced Practice Providers (APPs -  Physician Assistants and Nurse Practitioners) who all work together to provide you with the care you need, when you need it.     Your next appointment:   1 year(s)  Provider:   Christell Constant, MD

## 2023-06-01 NOTE — Progress Notes (Signed)
Cardiology Office Note:  .    Date:  06/01/2023  ID:  Crystal Spears, DOB 1941-01-04, MRN [id number] PCP: Adrian Prince, MD  [facility] Providers Cardiologist:  Christell Constant, MD     CC: Establish Care, HTN Consulted for the evaluation of PVCs at the behest of Dr. Evlyn Kanner   History of Present Illness: .    Crystal Spears is a 82 y.o. female with a history of HTN, HLD, and Frequent PVCs formerly seen by Dr. Eldridge Dace who presents to establish care with me.  Discussed the use of AI scribe software for clinical note transcription with the patient, who gave verbal consent to proceed.  Crystal Spears, a 82 year old patient with a history of hypertensive emergency and premature ventricular contractions (PVCs), presents for a routine check-up. The patient reports feeling well overall, with no new cardiac symptoms such as chest pain, breathing issues, or palpitations. However, she mentions a recent episode of lightheadedness accompanied by significantly elevated blood pressure, which was managed by doubling the dose of Lisinopril. The patient has been on Lisinopril for several years and reports feeling better since the dose adjustment.  In addition to the cardiac concerns, the patient recently underwent knee replacement surgery and is still recovering. She also reports some stress and apprehension related to the surgery and a recent hospitalization of her spouse.  The patient's cholesterol levels were noted to be at the upper limits of normal in a previous test in February. She has not had any recent labs since the Lisinopril dose adjustment. The patient also reports a history of PVCs, which were not present in today's exam.  The patient's blood pressure has been running a bit high lately, including at today's visit. She reports her blood pressure is usually within the normal range. The patient also mentions some swelling on the side of the recent knee surgery.   Relevant  histories: .  Social former [facility] patient, from [place], came with [facility] ROS: As per HPI.   Physical Exam:    VS:  BP (!) 142/60   Pulse 71   Ht 5\' 3"  (1.6 m)   Wt 172 lb 12.8 oz (78.4 kg)   SpO2 100%   BMI 30.61 kg/m    Wt Readings from Last 3 Encounters:  06/01/23 172 lb 12.8 oz (78.4 kg)  04/07/20 178 lb 3.2 oz (80.8 kg)  06/06/17 181 lb (82.1 kg)    Gen: no distress   Neck: No JVD Cardiac: No Rubs or Gallops, Soft systolic Murmur, largely regular with occasional abnormal beat+2 radial pulses Respiratory: Clear to auscultation bilaterally, normal effort, normal  respiratory rate GI: Soft, nontender, non-distended  MS: R Leg +1 edema;  moves all extremities Integument: Skin feels warm Neuro:  At time of evaluation, alert and oriented to person/place/time/situation  Psych: Normal affect, patient feels well   ASSESSMENT AND PLAN: .    Premature Ventricular Contractions (PVCs) - Asymptomatic with no PVCs on today's EKG. Normal heart function three years ago. Discussed potential benefits of a vasodilatory beta blocker for PVCs and hypertension if blood pressure remains elevated. - Order echocardiogram to reassess heart function - Consider vasodilatory beta blocker if blood pressure remains elevated  Systolic Heart Murmur - Order echocardiogram to evaluate heart murmur  Hypertension - Managed with lisinopril. Recent lightheadedness with elevated blood pressure led to increased lisinopril dosage. Blood pressure slightly elevated today. Discussed monitoring kidney function and potassium levels due to increased dosage. Potential addition of vasodilatory beta blocker  if blood pressure remains elevated. - Order basic metabolic panel to monitor kidney function and potassium levels - Recheck blood pressure during follow-up - Consider vasodilatory beta blocker if blood pressure remains elevated  Hyperlipidemia Elevated cholesterol at upper limits of normal. Discussed potential need  for aggressive cholesterol management if lipoprotein(a) levels are elevated. - Order LDL direct and lipoprotein(a) tests to assess cholesterol levels and genetic risk  Postoperative Knee Replacement Recent knee replacement surgery in late September. Mild swelling present but improving. Discussed monitoring for changes in swelling or complications. - Monitor for changes in swelling or complications, low threshold to had diuretic  General Health Maintenance - Schedule annual follow-up visit in one year unless new symptoms or issues arise  Follow-up - Follow-up in one year if asymptomatic and all tests are normal   Riley Lam, MD FASE [facility] Cardiologist [facility]  [address], [address] [place], [place] [address] [phone]  10:25 AM

## 2023-06-03 LAB — BASIC METABOLIC PANEL
BUN/Creatinine Ratio: 16 (ref 12–28)
BUN: 13 mg/dL (ref 8–27)
CO2: 26 mmol/L (ref 20–29)
Calcium: 9.6 mg/dL (ref 8.7–10.3)
Chloride: 105 mmol/L (ref 96–106)
Creatinine, Ser: 0.8 mg/dL (ref 0.57–1.00)
Glucose: 116 mg/dL — ABNORMAL HIGH (ref 70–99)
Potassium: 3.9 mmol/L (ref 3.5–5.2)
Sodium: 143 mmol/L (ref 134–144)
eGFR: 75 mL/min/{1.73_m2} (ref >59)

## 2023-06-03 LAB — LIPOPROTEIN A (LPA): Lipoprotein (a): 13.8 nmol/L (ref <75.0)

## 2023-06-03 LAB — LDL CHOLESTEROL, DIRECT: LDL Direct: 55 mg/dL (ref 0–99)

## 2023-06-05 ENCOUNTER — Telehealth: Payer: Self-pay | Admitting: Internal Medicine

## 2023-06-05 NOTE — Telephone Encounter (Signed)
Patient was returning call for results. Please advise °

## 2023-06-06 NOTE — Telephone Encounter (Signed)
Left a detailed message of results and MD comments.  Advised to call the office with questions and or concerns.

## 2023-06-29 ENCOUNTER — Other Ambulatory Visit (HOSPITAL_COMMUNITY): Payer: Self-pay | Admitting: Endocrinology

## 2023-06-29 DIAGNOSIS — R600 Localized edema: Secondary | ICD-10-CM

## 2023-06-30 ENCOUNTER — Ambulatory Visit (HOSPITAL_COMMUNITY)
Admission: RE | Admit: 2023-06-30 | Discharge: 2023-06-30 | Disposition: A | Discharge status: Home / Self Care | Payer: Medicare Other | Source: Ambulatory Visit | Attending: Endocrinology | Admitting: Endocrinology

## 2023-06-30 DIAGNOSIS — R600 Localized edema: Secondary | ICD-10-CM

## 2023-07-17 ENCOUNTER — Ambulatory Visit (HOSPITAL_COMMUNITY): Payer: Medicare Other | Attending: Internal Medicine

## 2023-07-17 DIAGNOSIS — I10 Essential (primary) hypertension: Secondary | ICD-10-CM | POA: Diagnosis present

## 2023-07-17 DIAGNOSIS — I493 Ventricular premature depolarization: Secondary | ICD-10-CM

## 2023-07-17 DIAGNOSIS — E782 Mixed hyperlipidemia: Secondary | ICD-10-CM | POA: Diagnosis present

## 2023-07-17 DIAGNOSIS — R011 Cardiac murmur, unspecified: Secondary | ICD-10-CM | POA: Diagnosis not present

## 2023-07-17 LAB — ECHOCARDIOGRAM COMPLETE
Area-P 1/2: 2.9 cm2
S' Lateral: 2.7 cm

## 2023-08-17 NOTE — Progress Notes (Signed)
Surgery orders requested via Epic inbox.

## 2023-08-22 ENCOUNTER — Ambulatory Visit: Payer: Self-pay | Admitting: Emergency Medicine

## 2023-08-22 DIAGNOSIS — G8929 Other chronic pain: Secondary | ICD-10-CM

## 2023-08-22 NOTE — H&P (View-Only) (Signed)
 TOTAL KNEE ADMISSION H&P  Patient is being admitted for left total knee arthroplasty.  Subjective:  Chief Complaint:left knee pain.  HPI: Crystal Spears, 83 y.o. female, has a history of pain and functional disability in the left knee due to arthritis and has failed non-surgical conservative treatments for greater than 12 weeks to includeNSAID's and/or analgesics, corticosteriod injections, viscosupplementation injections, and activity modification.  Onset of symptoms was gradual, starting 5 years ago with gradually worsening course since that time. The patient noted no past surgery on the left knee(s).  Patient currently rates pain in the left knee(s) at 8 out of 10 with activity. Patient has night pain, worsening of pain with activity and weight bearing, pain that interferes with activities of daily living, and pain with passive range of motion.  Patient has evidence of periarticular osteophytes and joint space narrowing by imaging studies.  There is no active infection.  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) 08/27/2018   Ventricular premature depolarization 08/27/2018   Encounter for general adult medical examination without abnormal findings 08/22/2018   Hypertension 06/06/2017   Thyroid nodule    Benign neoplasm of colon 08/05/2014   Disorder of bone 06/22/2009   Hyperlipidemia 06/22/2009   Vitamin D deficiency 06/22/2009   Past Medical History:  Diagnosis Date   Colon polyps    Dizziness    Elevated blood pressure reading without diagnosis of hypertension    Hyperlipidemia    Hyperparathyroidism, primary (HCC)    S/P parathyroidectomy   Hypothyroidism     Near syncope    Osteopenia    Osteoporosis    Pneumonia    Seasonal allergies    Thyroid nodule    HYPERPLASTIC    Past Surgical History:  Procedure Laterality Date   ABDOMINAL HYSTERECTOMY  1988   BREAST DUCTAL SYSTEM EXCISION  11/16/2011   Procedure: EXCISION DUCTAL SYSTEM BREAST;  Surgeon: Maisie Fus A. Cornett, MD;  Location: [facility];  Service: General;  Laterality: Left;  left breast central duct excision   CATARACT EXTRACTION, BILATERAL Bilateral 2013   CESAREAN SECTION  1972, 1978   parathyroid gland removal  2000   approximation of date   PARATHYROIDECTOMY      Current Outpatient Medications  Medication Sig Dispense Refill Last Dose/Taking   amoxicillin (AMOXIL) 500 MG tablet Take 2,000 mg by mouth See admin instructions. Take 4 tablets (2000 mg) by mouth 1 hour prior to dental appointments.      atorvastatin (LIPITOR) 20 MG tablet Take 20 mg by mouth in the morning.      Calcium Carbonate-Vitamin D (CALCIUM + D PO) Take 1 tablet by mouth 2 (two) times daily.      diclofenac (VOLTAREN) 50 MG EC tablet Take 50 mg by mouth at bedtime.      ketoconazole (NIZORAL) 2 % cream Apply topically as needed for irritation.      lisinopril (ZESTRIL) 20 MG tablet Take 20 mg by mouth in the morning.      Multiple Vitamins-Minerals (PRESERVISION AREDS 2 PO) Take 1 tablet by mouth in the morning and at bedtime.      Vitamin D, Ergocalciferol, (DRISDOL) 50000 UNITS CAPS Take 50,000  Units by mouth every Monday.      No current facility-administered medications for this visit.   No Known Allergies  Social History   Tobacco Use   Smoking status: Never   Smokeless tobacco: Never  Substance Use Topics   Alcohol use: No    Family History  Problem Relation Age of Onset   Stroke Mother 68   Cancer Father 3       lung   Breast cancer Neg Hx      Review of Systems  Musculoskeletal:  Positive for arthralgias.  All other systems reviewed and are  negative.   Objective:  Physical Exam Constitutional:      General: She is not in acute distress.    Appearance: Normal appearance. She is not ill-appearing.  HENT:     Head: Normocephalic and atraumatic.     Right Ear: External ear normal.     Left Ear: External ear normal.     Nose: Nose normal.     Mouth/Throat:     Mouth: Mucous membranes are moist.     Pharynx: Oropharynx is clear.  Eyes:     Extraocular Movements: Extraocular movements intact.     Conjunctiva/sclera: Conjunctivae normal.  Cardiovascular:     Rate and Rhythm: Normal rate and regular rhythm.     Pulses: Normal pulses.     Heart sounds: Normal heart sounds.  Pulmonary:     Effort: Pulmonary effort is normal.     Breath sounds: Normal breath sounds.  Abdominal:     General: Bowel sounds are normal.     Palpations: Abdomen is soft.     Tenderness: There is no abdominal tenderness.  Musculoskeletal:        General: Tenderness present.     Cervical back: Normal range of motion and neck supple.     Comments: TTP over medial and lateral joint line, lateral worse than medial.  No calf tenderness, swelling, or erythema.  No overlying lesions of area of chief complaint.  Decreased strength and ROM due to elicited pain.  Pre-operative ROM 0-110.  Dorsiflexion and plantarflexion intact.  Stable to varus and valgus stress.  BLE appear grossly neurovascularly intact.  Gait mildly antalgic.   Skin:    General: Skin is warm and dry.  Neurological:     Mental Status: She is alert and oriented to person, place, and time. Mental status is at baseline.  Psychiatric:        Mood and Affect: Mood normal.        Behavior: Behavior normal.     Vital signs in last 24 hours: @VSRANGES @  Labs:   Estimated body mass index is 30.61 kg/m as calculated from the following:   Height as of 06/01/23: 5\' 3"  (1.6 m).   Weight as of 06/01/23: 78.4 kg.   Imaging Review Plain radiographs demonstrate severe degenerative joint  disease of the left knee(s). The overall alignment ismild valgus. The bone quality appears to be fair for age and reported activity level.      Assessment/Plan:  End stage arthritis, left knee   The patient history, physical examination, clinical judgment of the provider and imaging studies are consistent with end stage degenerative joint disease of the left knee(s) and total knee arthroplasty is deemed medically necessary. The treatment options including medical management, injection therapy arthroscopy and arthroplasty were discussed at length. The risks and benefits of total knee arthroplasty were presented and reviewed. The risks due to aseptic loosening, infection, stiffness,  patella tracking problems, thromboembolic complications and other imponderables were discussed. The patient acknowledged the explanation, agreed to proceed with the plan and consent was signed. Patient is being admitted for inpatient treatment for surgery, pain control, PT, OT, prophylactic antibiotics, VTE prophylaxis, progressive ambulation and ADL's and discharge planning. The patient is planning to be discharged home with outpatient PT.     Patient's anticipated LOS is less than 2 midnights, meeting these requirements: - Lives within 1 hour of care - Has a competent adult at home to recover with post-op recover - NO history of  - Chronic pain requiring opiods  - Diabetes  - Coronary Artery Disease  - Heart failure  - Heart attack  - Stroke  - DVT/VTE  - Cardiac arrhythmia  - Respiratory Failure/COPD  - Renal failure  - Anemia  - Advanced Liver disease

## 2023-08-22 NOTE — H&P (Signed)
 TOTAL KNEE ADMISSION H&P  Patient is being admitted for left total knee arthroplasty.  Subjective:  Chief Complaint:left knee pain.  HPI: Crystal Spears, 83 y.o. female, has a history of pain and functional disability in the left knee due to arthritis and has failed non-surgical conservative treatments for greater than 12 weeks to includeNSAID's and/or analgesics, corticosteriod injections, viscosupplementation injections, and activity modification.  Onset of symptoms was gradual, starting 5 years ago with gradually worsening course since that time. The patient noted no past surgery on the left knee(s).  Patient currently rates pain in the left knee(s) at 8 out of 10 with activity. Patient has night pain, worsening of pain with activity and weight bearing, pain that interferes with activities of daily living, and pain with passive range of motion.  Patient has evidence of periarticular osteophytes and joint space narrowing by imaging studies.  There is no active infection.  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) 08/27/2018   Ventricular premature depolarization 08/27/2018   Encounter for general adult medical examination without abnormal findings 08/22/2018   Hypertension 06/06/2017   Thyroid nodule    Benign neoplasm of colon 08/05/2014   Disorder of bone 06/22/2009   Hyperlipidemia 06/22/2009   Vitamin D deficiency 06/22/2009   Past Medical History:  Diagnosis Date   Colon polyps    Dizziness    Elevated blood pressure reading without diagnosis of hypertension    Hyperlipidemia    Hyperparathyroidism, primary (HCC)    S/P parathyroidectomy   Hypothyroidism     Near syncope    Osteopenia    Osteoporosis    Pneumonia    Seasonal allergies    Thyroid nodule    HYPERPLASTIC    Past Surgical History:  Procedure Laterality Date   ABDOMINAL HYSTERECTOMY  1988   BREAST DUCTAL SYSTEM EXCISION  11/16/2011   Procedure: EXCISION DUCTAL SYSTEM BREAST;  Surgeon: Debby A. Cornett, MD;  Location: [facility];  Service: General;  Laterality: Left;  left breast central duct excision   CATARACT EXTRACTION, BILATERAL Bilateral 2013   CESAREAN SECTION  1972, 1978   parathyroid  gland removal  2000   approximation of date   PARATHYROIDECTOMY      Current Outpatient Medications  Medication Sig Dispense Refill Last Dose/Taking   amoxicillin (AMOXIL) 500 MG tablet Take 2,000 mg by mouth See admin instructions. Take 4 tablets (2000 mg) by mouth 1 hour prior to dental appointments.      atorvastatin (LIPITOR) 20 MG tablet Take 20 mg by mouth in the morning.      Calcium Carbonate-Vitamin D (CALCIUM + D PO) Take 1 tablet by mouth 2 (two) times daily.      diclofenac (VOLTAREN) 50 MG EC tablet Take 50 mg by mouth at bedtime.      ketoconazole (NIZORAL) 2 % cream Apply topically as needed for irritation.      lisinopril  (ZESTRIL ) 20 MG tablet Take 20 mg by mouth in the morning.      Multiple Vitamins-Minerals (PRESERVISION AREDS 2 PO) Take 1 tablet by mouth in the morning and at bedtime.      Vitamin D, Ergocalciferol, (DRISDOL) 50000 UNITS CAPS Take 50,000  Units by mouth every Monday.      No current facility-administered medications for this visit.   No Known Allergies  Social History   Tobacco Use   Smoking status: Never   Smokeless tobacco: Never  Substance Use Topics   Alcohol  use: No    Family History  Problem Relation Age of Onset   Stroke Mother 97   Cancer Father 9       lung   Breast cancer Neg Hx      Review of Systems  Musculoskeletal:  Positive for arthralgias.  All other systems reviewed and are  negative.   Objective:  Physical Exam Constitutional:      General: She is not in acute distress.    Appearance: Normal appearance. She is not ill-appearing.  HENT:     Head: Normocephalic and atraumatic.     Right Ear: External ear normal.     Left Ear: External ear normal.     Nose: Nose normal.     Mouth/Throat:     Mouth: Mucous membranes are moist.     Pharynx: Oropharynx is clear.  Eyes:     Extraocular Movements: Extraocular movements intact.     Conjunctiva/sclera: Conjunctivae normal.  Cardiovascular:     Rate and Rhythm: Normal rate and regular rhythm.     Pulses: Normal pulses.     Heart sounds: Normal heart sounds.  Pulmonary:     Effort: Pulmonary effort is normal.     Breath sounds: Normal breath sounds.  Abdominal:     General: Bowel sounds are normal.     Palpations: Abdomen is soft.     Tenderness: There is no abdominal tenderness.  Musculoskeletal:        General: Tenderness present.     Cervical back: Normal range of motion and neck supple.     Comments: TTP over medial and lateral joint line, lateral worse than medial.  No calf tenderness, swelling, or erythema.  No overlying lesions of area of chief complaint.  Decreased strength and ROM due to elicited pain.  Pre-operative ROM 0-110.  Dorsiflexion and plantarflexion intact.  Stable to varus and valgus stress.  BLE appear grossly neurovascularly intact.  Gait mildly antalgic.   Skin:    General: Skin is warm and dry.  Neurological:     Mental Status: She is alert and oriented to person, place, and time. Mental status is at baseline.  Psychiatric:        Mood and Affect: Mood normal.        Behavior: Behavior normal.     Vital signs in last 24 hours: @VSRANGES @  Labs:   Estimated body mass index is 30.61 kg/m as calculated from the following:   Height as of 06/01/23: 5' 3 (1.6 m).   Weight as of 06/01/23: 78.4 kg.   Imaging Review Plain radiographs demonstrate severe degenerative joint  disease of the left knee(s). The overall alignment ismild valgus. The bone quality appears to be fair for age and reported activity level.      Assessment/Plan:  End stage arthritis, left knee   The patient history, physical examination, clinical judgment of the provider and imaging studies are consistent with end stage degenerative joint disease of the left knee(s) and total knee arthroplasty is deemed medically necessary. The treatment options including medical management, injection therapy arthroscopy and arthroplasty were discussed at length. The risks and benefits of total knee arthroplasty were presented and reviewed. The risks due to aseptic loosening, infection, stiffness,  patella tracking problems, thromboembolic complications and other imponderables were discussed. The patient acknowledged the explanation, agreed to proceed with the plan and consent was signed. Patient is being admitted for inpatient treatment for surgery, pain control, PT, OT, prophylactic antibiotics, VTE prophylaxis, progressive ambulation and ADL's and discharge planning. The patient is planning to be discharged home with outpatient PT.     Patient's anticipated LOS is less than 2 midnights, meeting these requirements: - Lives within 1 hour of care - Has a competent adult at home to recover with post-op recover - NO history of  - Chronic pain requiring opiods  - Diabetes  - Coronary Artery Disease  - Heart failure  - Heart attack  - Stroke  - DVT/VTE  - Cardiac arrhythmia  - Respiratory Failure/COPD  - Renal failure  - Anemia  - Advanced Liver disease

## 2023-08-23 NOTE — Patient Instructions (Signed)
 DUE TO COVID-19 ONLY TWO VISITORS  (aged 81 and older)  ARE ALLOWED TO COME WITH YOU AND STAY IN THE WAITING ROOM ONLY DURING PRE OP AND PROCEDURE.   **NO VISITORS ARE ALLOWED IN THE SHORT STAY AREA OR RECOVERY ROOM!!**  IF YOU WILL BE ADMITTED INTO THE HOSPITAL YOU ARE ALLOWED ONLY FOUR SUPPORT PEOPLE DURING VISITATION HOURS ONLY (7 AM -8PM)   The support person(s) must pass our screening, gel in and out, and wear a mask at all times, including in the patient's room. Patients must also wear a mask when staff or their support person are in the room. Visitors GUEST BADGE MUST BE WORN VISIBLY  One adult visitor may remain with you overnight and MUST be in the room by 8 P.M.     Your procedure is scheduled on: 09/06/23   Report to [facility] Main Entrance    Report to admitting at : 6:00 AM   Call this number if you have problems the morning of surgery [phone]   Do not eat food :After Midnight.   After Midnight you may have the following liquids until : 5:30 AM DAY OF SURGERY  Water  Black Coffee (sugar ok, NO MILK/CREAM OR CREAMERS)  Tea (sugar ok, NO MILK/CREAM OR CREAMERS) regular and decaf                             Plain Jell-O (NO RED)                                           Fruit ices (not with fruit pulp, NO RED)                                     Popsicles (NO RED)                                                                  Juice: apple, WHITE grape, WHITE cranberry Sports drinks like Gatorade (NO RED)   The day of surgery:  Drink ONE (1) Pre-Surgery Clear Ensure at : 5:30 AM the morning of surgery. Drink in one sitting. Do not sip.  This drink was given to you during your hospital  pre-op appointment visit. Nothing else to drink after completing the  Pre-Surgery Clear Ensure or G2.          If you have questions, please contact your surgeon's office.  FOLLOW ANY ADDITIONAL PRE OP INSTRUCTIONS YOU RECEIVED FROM YOUR SURGEON'S OFFICE!!!   Oral  Hygiene is also important to reduce your risk of infection.                                    Remember - BRUSH YOUR TEETH THE MORNING OF SURGERY WITH YOUR REGULAR TOOTHPASTE  DENTURES WILL BE REMOVED PRIOR TO SURGERY PLEASE DO NOT APPLY Poly grip OR ADHESIVES!!!   Do NOT smoke after Midnight   Take these medicines the morning of surgery  with A SIP OF WATER : NONE. Stop vitamins,supplements and over the counter medicines 7 days before surgery.                              You may not have any metal on your body including hair pins, jewelry, and body piercing             Do not wear make-up, lotions, powders, perfumes/cologne, or deodorant  Do not wear nail polish including gel and S&S, artificial/acrylic nails, or any other type of covering on natural nails including finger and toenails. If you have artificial nails, gel coating, etc. that needs to be removed by a nail salon please have this removed prior to surgery or surgery may need to be canceled/ delayed if the surgeon/ anesthesia feels like they are unable to be safely monitored.   Do not shave  48 hours prior to surgery.    Do not bring valuables to the hospital. [facility] IS NOT             RESPONSIBLE   FOR VALUABLES.   Contacts, glasses, or bridgework may not be worn into surgery.   Bring small overnight bag day of surgery.   DO NOT BRING YOUR HOME MEDICATIONS TO THE HOSPITAL. PHARMACY WILL DISPENSE MEDICATIONS LISTED ON YOUR MEDICATION LIST TO YOU DURING YOUR ADMISSION IN THE HOSPITAL!    Patients discharged on the day of surgery will not be allowed to drive home.  Someone NEEDS to stay with you for the first 24 hours after anesthesia.   Special Instructions: Bring a copy of your healthcare power of attorney and living will documents         the day of surgery if you haven't scanned them before.              Please read over the following fact sheets you were given: IF YOU HAVE QUESTIONS ABOUT YOUR PRE-OP INSTRUCTIONS  PLEASE CALL [phone]      Pre-operative 5 CHG Bath Instructions   You can play a key role in reducing the risk of infection after surgery. Your skin needs to be as free of germs as possible. You can reduce the number of germs on your skin by washing with CHG (chlorhexidine  gluconate) soap before surgery. CHG is an antiseptic soap that kills germs and continues to kill germs even after washing.   DO NOT use if you have an allergy to chlorhexidine /CHG or antibacterial soaps. If your skin becomes reddened or irritated, stop using the CHG and notify one of our RNs at : [phone].   Please shower with the CHG soap starting 4 days before surgery using the following schedule:     Please keep in mind the following:  DO NOT shave, including legs and underarms, starting the day of your first shower.   You may shave your face at any point before/day of surgery.  Place clean sheets on your bed the day you start using CHG soap. Use a clean washcloth (not used since being washed) for each shower. DO NOT sleep with pets once you start using the CHG.   CHG Shower Instructions:  If you choose to wash your hair and private area, wash first with your normal shampoo/soap.  After you use shampoo/soap, rinse your hair and body thoroughly to remove shampoo/soap residue.  Turn the water  OFF and apply about 3 tablespoons (45 ml) of CHG soap to a  CLEAN washcloth.  Apply CHG soap ONLY FROM YOUR NECK DOWN TO YOUR TOES (washing for 3-5 minutes)  DO NOT use CHG soap on face, private areas, open wounds, or sores.  Pay special attention to the area where your surgery is being performed.  If you are having back surgery, having someone wash your back for you may be helpful. Wait 2 minutes after CHG soap is applied, then you may rinse off the CHG soap.  Pat dry with a clean towel  Put on clean clothes/pajamas   If you choose to wear lotion, please use ONLY the CHG-compatible lotions on the back of this paper.      Additional instructions for the day of surgery: DO NOT APPLY any lotions, deodorants, cologne, or perfumes.   Put on clean/comfortable clothes.  Brush your teeth.  Ask your nurse before applying any prescription medications to the skin.   CHG Compatible Lotions   Aveeno Moisturizing lotion  Cetaphil Moisturizing Cream  Cetaphil Moisturizing Lotion  Clairol Herbal Essence Moisturizing Lotion, Dry Skin  Clairol Herbal Essence Moisturizing Lotion, Extra Dry Skin  Clairol Herbal Essence Moisturizing Lotion, Normal Skin  Curel Age Defying Therapeutic Moisturizing Lotion with Alpha Hydroxy  Curel Extreme Care Body Lotion  Curel Soothing Hands Moisturizing Hand Lotion  Curel Therapeutic Moisturizing Cream, Fragrance-Free  Curel Therapeutic Moisturizing Lotion, Fragrance-Free  Curel Therapeutic Moisturizing Lotion, Original Formula  Eucerin Daily Replenishing Lotion  Eucerin Dry Skin Therapy Plus Alpha Hydroxy Crme  Eucerin Dry Skin Therapy Plus Alpha Hydroxy Lotion  Eucerin Original Crme  Eucerin Original Lotion  Eucerin Plus Crme Eucerin Plus Lotion  Eucerin TriLipid Replenishing Lotion  Keri Anti-Bacterial Hand Lotion  Keri Deep Conditioning Original Lotion Dry Skin Formula Softly Scented  Keri Deep Conditioning Original Lotion, Fragrance Free Sensitive Skin Formula  Keri Lotion Fast Absorbing Fragrance Free Sensitive Skin Formula  Keri Lotion Fast Absorbing Softly Scented Dry Skin Formula  Keri Original Lotion  Keri Skin Renewal Lotion Keri Silky Smooth Lotion  Keri Silky Smooth Sensitive Skin Lotion  Nivea Body Creamy Conditioning Oil  Nivea Body Extra Enriched Lotion  Nivea Body Original Lotion  Nivea Body Sheer Moisturizing Lotion Nivea Crme  Nivea Skin Firming Lotion  NutraDerm 30 Skin Lotion  NutraDerm Skin Lotion  NutraDerm Therapeutic Skin Cream  NutraDerm Therapeutic Skin Lotion  ProShield Protective Hand Cream  Provon moisturizing lotion   Incentive  Spirometer  An incentive spirometer is a tool that can help keep your lungs clear and active. This tool measures how well you are filling your lungs with each breath. Taking long deep breaths may help reverse or decrease the chance of developing breathing (pulmonary) problems (especially infection) following: A long period of time when you are unable to move or be active. BEFORE THE PROCEDURE  If the spirometer includes an indicator to show your best effort, your nurse or respiratory therapist will set it to a desired goal. If possible, sit up straight or lean slightly forward. Try not to slouch. Hold the incentive spirometer in an upright position. INSTRUCTIONS FOR USE  Sit on the edge of your bed if possible, or sit up as far as you can in bed or on a chair. Hold the incentive spirometer in an upright position. Breathe out normally. Place the mouthpiece in your mouth and seal your lips tightly around it. Breathe in slowly and as deeply as possible, raising the piston or the ball toward the top of the column. Hold your breath for 3-5 seconds or for  as long as possible. Allow the piston or ball to fall to the bottom of the column. Remove the mouthpiece from your mouth and breathe out normally. Rest for a few seconds and repeat Steps 1 through 7 at least 10 times every 1-2 hours when you are awake. Take your time and take a few normal breaths between deep breaths. The spirometer may include an indicator to show your best effort. Use the indicator as a goal to work toward during each repetition. After each set of 10 deep breaths, practice coughing to be sure your lungs are clear. If you have an incision (the cut made at the time of surgery), support your incision when coughing by placing a pillow or rolled up towels firmly against it. Once you are able to get out of bed, walk around indoors and cough well. You may stop using the incentive spirometer when instructed by your caregiver.  RISKS AND  COMPLICATIONS Take your time so you do not get dizzy or light-headed. If you are in pain, you may need to take or ask for pain medication before doing incentive spirometry. It is harder to take a deep breath if you are having pain. AFTER USE Rest and breathe slowly and easily. It can be helpful to keep track of a log of your progress. Your caregiver can provide you with a simple table to help with this. If you are using the spirometer at home, follow these instructions: SEEK MEDICAL CARE IF:  You are having difficultly using the spirometer. You have trouble using the spirometer as often as instructed. Your pain medication is not giving enough relief while using the spirometer. You develop fever of 100.5 F (38.1 C) or higher. SEEK IMMEDIATE MEDICAL CARE IF:  You cough up bloody sputum that had not been present before. You develop fever of 102 F (38.9 C) or greater. You develop worsening pain at or near the incision site. MAKE SURE YOU:  Understand these instructions. Will watch your condition. Will get help right away if you are not doing well or get worse. Document Released: 11/14/2006 Document Revised: 09/26/2011 Document Reviewed: 01/15/2007 [facility] Patient Information 2014 [place], [place].   ________________________________________________________________________

## 2023-08-24 ENCOUNTER — Encounter (HOSPITAL_COMMUNITY)
Admission: RE | Admit: 2023-08-24 | Discharge: 2023-08-24 | Disposition: A | Discharge status: Home / Self Care | Payer: Medicare Other | Source: Ambulatory Visit | Attending: Orthopedic Surgery | Admitting: Orthopedic Surgery

## 2023-08-24 ENCOUNTER — Encounter (HOSPITAL_COMMUNITY): Payer: Self-pay

## 2023-08-24 ENCOUNTER — Other Ambulatory Visit: Payer: Self-pay

## 2023-08-24 VITALS — BP 140/56 | HR 73 | Temp 97.5°F | Ht 63.0 in | Wt 164.0 lb

## 2023-08-24 DIAGNOSIS — I10 Essential (primary) hypertension: Secondary | ICD-10-CM | POA: Diagnosis not present

## 2023-08-24 DIAGNOSIS — G8929 Other chronic pain: Secondary | ICD-10-CM | POA: Diagnosis not present

## 2023-08-24 DIAGNOSIS — M1712 Unilateral primary osteoarthritis, left knee: Secondary | ICD-10-CM | POA: Diagnosis not present

## 2023-08-24 DIAGNOSIS — Z01812 Encounter for preprocedural laboratory examination: Secondary | ICD-10-CM | POA: Insufficient documentation

## 2023-08-24 DIAGNOSIS — M25562 Pain in left knee: Secondary | ICD-10-CM | POA: Diagnosis not present

## 2023-08-24 DIAGNOSIS — Z96651 Presence of right artificial knee joint: Secondary | ICD-10-CM | POA: Diagnosis not present

## 2023-08-24 DIAGNOSIS — Z01818 Encounter for other preprocedural examination: Secondary | ICD-10-CM

## 2023-08-24 DIAGNOSIS — E039 Hypothyroidism, unspecified: Secondary | ICD-10-CM | POA: Insufficient documentation

## 2023-08-24 DIAGNOSIS — I493 Ventricular premature depolarization: Secondary | ICD-10-CM | POA: Insufficient documentation

## 2023-08-24 HISTORY — DX: Unspecified macular degeneration: H35.30

## 2023-08-24 HISTORY — DX: Ventricular premature depolarization: I49.3

## 2023-08-24 HISTORY — DX: Essential (primary) hypertension: I10

## 2023-08-24 HISTORY — DX: Unspecified osteoarthritis, unspecified site: M19.90

## 2023-08-24 LAB — CBC WITH DIFFERENTIAL/PLATELET
Abs Immature Granulocytes: 0.02 10*3/uL (ref 0.00–0.07)
Basophils Absolute: 0.1 10*3/uL (ref 0.0–0.1)
Basophils Relative: 1 %
Eosinophils Absolute: 0.1 10*3/uL (ref 0.0–0.5)
Eosinophils Relative: 2 %
HCT: 42.6 % (ref 36.0–46.0)
Hemoglobin: 13.9 g/dL (ref 12.0–15.0)
Immature Granulocytes: 0 %
Lymphocytes Relative: 40 %
Lymphs Abs: 2.3 10*3/uL (ref 0.7–4.0)
MCH: 31.6 pg (ref 26.0–34.0)
MCHC: 32.6 g/dL (ref 30.0–36.0)
MCV: 96.8 fL (ref 80.0–100.0)
Monocytes Absolute: 0.6 10*3/uL (ref 0.1–1.0)
Monocytes Relative: 11 %
Neutro Abs: 2.6 10*3/uL (ref 1.7–7.7)
Neutrophils Relative %: 46 %
Platelets: 267 10*3/uL (ref 150–400)
RBC: 4.4 MIL/uL (ref 3.87–5.11)
RDW: 12.4 % (ref 11.5–15.5)
WBC: 5.7 10*3/uL (ref 4.0–10.5)
nRBC: 0 % (ref 0.0–0.2)

## 2023-08-24 LAB — SURGICAL PCR SCREEN
MRSA, PCR: NEGATIVE
Staphylococcus aureus: NEGATIVE

## 2023-08-24 LAB — TYPE AND SCREEN
ABO/RH(D): B POS
Antibody Screen: NEGATIVE

## 2023-08-24 LAB — COMPREHENSIVE METABOLIC PANEL
ALT: 20 U/L (ref 0–44)
AST: 22 U/L (ref 15–41)
Albumin: 3.8 g/dL (ref 3.5–5.0)
Alkaline Phosphatase: 89 U/L (ref 38–126)
Anion gap: 10 (ref 5–15)
BUN: 28 mg/dL — ABNORMAL HIGH (ref 8–23)
CO2: 27 mmol/L (ref 22–32)
Calcium: 9.8 mg/dL (ref 8.9–10.3)
Chloride: 107 mmol/L (ref 98–111)
Creatinine, Ser: 0.93 mg/dL (ref 0.44–1.00)
GFR, Estimated: >60 mL/min (ref >60)
Glucose, Bld: 98 mg/dL (ref 70–99)
Potassium: 3.9 mmol/L (ref 3.5–5.1)
Sodium: 144 mmol/L (ref 135–145)
Total Bilirubin: 1 mg/dL (ref 0.0–1.2)
Total Protein: 7 g/dL (ref 6.5–8.1)

## 2023-08-24 NOTE — Progress Notes (Addendum)
 For Anesthesia: PCP - Nichole Senior, MD  Cardiologist - Santo Kelly MD. Crystal Spears: 06/01/23  Bowel Prep reminder:  Chest x-ray -  EKG - 05/31/24 Stress Test -  ECHO - 07/17/23 Cardiac Cath -  Pacemaker/ICD device last checked: Pacemaker orders received: Device Rep notified:  Spinal Cord Stimulator:N/A  Sleep Study - N/A CPAP -   Fasting Blood Sugar - N/A Checks Blood Sugar _____ times a day Date and result of last Hgb A1c-  Last dose of GLP1 agonist- N/A GLP1 instructions:   Last dose of SGLT-2 inhibitors- N/A SGLT-2 instructions:   Blood Thinner Instructions:N/A Aspirin  Instructions: Last Dose:  Activity level: Can go up a flight of stairs and activities of daily living without stopping and without chest pain and/or shortness of breath   Able to exercise without chest pain and/or shortness of breath    Anesthesia review: Hx: HTN,PVC's.  Patient denies shortness of breath, fever, cough and chest pain at PAT appointment   Patient verbalized understanding of instructions that were given to them at the PAT appointment. Patient was also instructed that they will need to review over the PAT instructions again at home before surgery.

## 2023-08-25 ENCOUNTER — Encounter (HOSPITAL_COMMUNITY): Payer: Self-pay

## 2023-08-25 NOTE — Anesthesia Preprocedure Evaluation (Addendum)
Anesthesia Evaluation  Patient identified by MRN, date of birth, ID band Patient awake    Reviewed: Allergy & Precautions, NPO status , Patient's Chart, lab work & pertinent test results  Airway Mallampati: II  TM Distance: >3 FB Neck ROM: Full    Dental no notable dental hx.    Pulmonary neg pulmonary ROS   Pulmonary exam normal        Cardiovascular hypertension, Pt. on medications  Rhythm:Regular Rate:Normal  IMPRESSIONS      1. Left ventricular ejection fraction, by estimation, is 60 to 65%. The left ventricle has normal function. The left ventricle has no regional wall motion abnormalities. Left ventricular diastolic parameters are consistent with Grade I diastolic dysfunction (impaired relaxation). The average left ventricular global longitudinal strain is -18.4 %. The global longitudinal strain is normal.  2. Right ventricular systolic function is normal. The right ventricular size is normal. There is normal pulmonary artery systolic pressure.  3. The mitral valve is normal in structure. Trivial mitral valve regurgitation. No evidence of mitral stenosis.  4. The aortic valve is tricuspid. Aortic valve regurgitation is not visualized. No aortic stenosis is present.  5. The inferior vena cava is normal in size with greater than 50% respiratory variability, suggesting right atrial pressure of 3 mmHg.    Neuro/Psych negative neurological ROS  negative psych ROS   GI/Hepatic negative GI ROS, Neg liver ROS,,,  Endo/Other  Hypothyroidism    Renal/GU   negative genitourinary   Musculoskeletal  (+) Arthritis , Osteoarthritis,    Abdominal Normal abdominal exam  (+)   Peds  Hematology Lab Results      Component                Value               Date                      WBC                      5.7                 08/24/2023                HGB                      13.9                08/24/2023                 HCT                      42.6                08/24/2023                MCV                      96.8                08/24/2023                PLT                      267                 08/24/2023  Anesthesia Other Findings   Reproductive/Obstetrics                             Anesthesia Physical Anesthesia Plan  ASA: 2  Anesthesia Plan: MAC, Regional and Spinal   Post-op Pain Management: Regional block*   Induction: Intravenous  PONV Risk Score and Plan: 2 and Ondansetron, Dexamethasone, Propofol infusion and Treatment may vary due to age or medical condition  Airway Management Planned: Simple Face Mask and Nasal Cannula  Additional Equipment: None  Intra-op Plan:   Post-operative Plan:   Informed Consent: I have reviewed the patients History and Physical, chart, labs and discussed the procedure including the risks, benefits and alternatives for the proposed anesthesia with the patient or authorized representative who has indicated his/her understanding and acceptance.     Dental advisory given  Plan Discussed with: CRNA  Anesthesia Plan Comments: (See PAT note from 2/6 by K Gekas PA-C )        Anesthesia Quick Evaluation

## 2023-08-25 NOTE — Progress Notes (Signed)
 Case: [id number] Date/Time: 09/06/23 0815   Procedure: TOTAL KNEE ARTHROPLASTY (Left: Knee)   Anesthesia type: Spinal   Pre-op diagnosis: OA LEFT KNEE   Location: [facility] ROOM 08 / [facility]   Surgeons: Edna Toribio LABOR, MD       DISCUSSION: Crystal Spears is an 83 yo female who presents to PAT prior to surgery above. PMH of HTN, PVCs, allergies, hypothyroidism, arthritis. She is s/p R TKA in 03/2023 (unable to review anesthesia records).  Pt follows with Cardiology for HTN and frequent PVCs. Last seen on 06/01/23. Echo was ordered to reassess heart function and came back with normal EF, grade I DD. EKG NSR without PVCs. BP noted to be elevated. Advised to monitor BP and f/u in 1 year.  Last seen by PCP on 06/29/23. BP controlled at that visit.  VS: BP (!) 140/56   Pulse 73   Temp (!) 36.4 C (Oral)   Ht 5' 3 (1.6 m)   SpO2 100%   BMI 30.61 kg/m   PROVIDERS: Nichole Senior, MD Cardiology: Santo Deniya Craigo MD  LABS: Labs reviewed: Acceptable for surgery. (all labs ordered are listed, but only abnormal results are displayed)  Labs Reviewed  COMPREHENSIVE METABOLIC PANEL - Abnormal; Notable for the following components:      Result Value   BUN 28 (*)    All other components within normal limits  SURGICAL PCR SCREEN  CBC WITH DIFFERENTIAL/PLATELET  TYPE AND SCREEN     IMAGES:   EKG 06/01/23:  NSR, rate 71  CV: Echo 07/17/23:  IMPRESSIONS    1. Left ventricular ejection fraction, by estimation, is 60 to 65%. The left ventricle has normal function. The left ventricle has no regional wall motion abnormalities. Left ventricular diastolic parameters are consistent with Grade I diastolic dysfunction (impaired relaxation). The average left ventricular global longitudinal strain is -18.4 %. The global longitudinal strain is normal.  2. Right ventricular systolic function is normal. The right ventricular size is normal. There is normal pulmonary artery systolic  pressure.  3. The mitral valve is normal in structure. Trivial mitral valve regurgitation. No evidence of mitral stenosis.  4. The aortic valve is tricuspid. Aortic valve regurgitation is not visualized. No aortic stenosis is present.  5. The inferior vena cava is normal in size with greater than 50% respiratory variability, suggesting right atrial pressure of 3 mmHg.  Holter monitor 05/02/2017: Frequent PVCs, 10,000 over 29-hour. (10% of total heartbeats), occasional bigeminy, rare triplet Rare PACs, 100. No atrial fibrillation, no pauses, no sustained ventricular tachycardia Symptoms seem to correlate with PVCs since they are so prevalent. Lightheadedness for instance correlated with sinus rhythm and ventricular quadrigeminy.  Past Medical History:  Diagnosis Date   Arthritis    Colon polyps    Dizziness    Elevated blood pressure reading without diagnosis of hypertension    Hyperlipidemia    Hyperparathyroidism, primary (HCC)    S/P parathyroidectomy   Hypertension    Hypothyroidism    Macular degeneration    Near syncope    Osteopenia    Osteoporosis    Pneumonia    PVC (premature ventricular contraction)    Seasonal allergies    Thyroid nodule    HYPERPLASTIC    Past Surgical History:  Procedure Laterality Date   ABDOMINAL HYSTERECTOMY  1988   ARTHROPLASTY Right 03/2023   knee   BREAST DUCTAL SYSTEM EXCISION  11/16/2011   Procedure: EXCISION DUCTAL SYSTEM BREAST;  Surgeon: Debby A. Cornett, MD;  Location: [facility]  [place];  Service: General;  Laterality: Left;  left breast central duct excision   CATARACT EXTRACTION, BILATERAL Bilateral 2013   CESAREAN SECTION  1972, 1978   COLONOSCOPY     parathyroid  gland removal  2000   approximation of date   PARATHYROIDECTOMY      MEDICATIONS:  amoxicillin (AMOXIL) 500 MG tablet   atorvastatin (LIPITOR) 20 MG tablet   Calcium Carbonate-Vitamin D (CALCIUM + D PO)   diclofenac (VOLTAREN) 50 MG EC tablet    ketoconazole (NIZORAL) 2 % cream   lisinopril  (ZESTRIL ) 20 MG tablet   Multiple Vitamins-Minerals (PRESERVISION AREDS 2 PO)   Vitamin D, Ergocalciferol, (DRISDOL) 50000 UNITS CAPS   No current facility-administered medications for this encounter.   Burnard CHRISTELLA Odis DEVONNA [facility] Short Stay/Anesthesiology [facility] Phone [phone] 08/25/2023 1:56 PM

## 2023-09-06 ENCOUNTER — Ambulatory Visit (HOSPITAL_COMMUNITY)
Admission: RE | Admit: 2023-09-06 | Discharge: 2023-09-06 | Disposition: A | Discharge status: Home / Self Care | Payer: Medicare Other | Source: Ambulatory Visit | Attending: Orthopedic Surgery | Admitting: Orthopedic Surgery

## 2023-09-06 ENCOUNTER — Other Ambulatory Visit: Payer: Self-pay

## 2023-09-06 ENCOUNTER — Ambulatory Visit (HOSPITAL_COMMUNITY): Payer: Medicare Other | Admitting: Medical

## 2023-09-06 ENCOUNTER — Encounter (HOSPITAL_COMMUNITY)
Admission: RE | Disposition: A | Discharge status: Home / Self Care | Payer: Self-pay | Source: Ambulatory Visit | Attending: Orthopedic Surgery

## 2023-09-06 ENCOUNTER — Ambulatory Visit (HOSPITAL_COMMUNITY): Payer: Medicare Other

## 2023-09-06 ENCOUNTER — Ambulatory Visit (HOSPITAL_COMMUNITY): Payer: Medicare Other | Admitting: Certified Registered Nurse Anesthetist

## 2023-09-06 ENCOUNTER — Encounter (HOSPITAL_COMMUNITY): Payer: Self-pay | Admitting: Orthopedic Surgery

## 2023-09-06 DIAGNOSIS — I129 Hypertensive chronic kidney disease with stage 1 through stage 4 chronic kidney disease, or unspecified chronic kidney disease: Secondary | ICD-10-CM | POA: Insufficient documentation

## 2023-09-06 DIAGNOSIS — Z79899 Other long term (current) drug therapy: Secondary | ICD-10-CM | POA: Insufficient documentation

## 2023-09-06 DIAGNOSIS — N1831 Chronic kidney disease, stage 3a: Secondary | ICD-10-CM | POA: Diagnosis not present

## 2023-09-06 DIAGNOSIS — M1712 Unilateral primary osteoarthritis, left knee: Secondary | ICD-10-CM | POA: Diagnosis present

## 2023-09-06 HISTORY — PX: TOTAL KNEE ARTHROPLASTY: SHX125

## 2023-09-06 LAB — ABO/RH: ABO/RH(D): B POS

## 2023-09-06 SURGERY — ARTHROPLASTY, KNEE, TOTAL
Anesthesia: Monitor Anesthesia Care | Site: Knee | Laterality: Left

## 2023-09-06 MED ORDER — ACETAMINOPHEN 500 MG PO TABS
1000.0000 mg | ORAL_TABLET | Freq: Once | ORAL | Status: AC
  Administered 2023-09-06: 1000 mg via ORAL
  Filled 2023-09-06: qty 2

## 2023-09-06 MED ORDER — POVIDONE-IODINE 10 % EX SWAB
2.0000 | Freq: Once | CUTANEOUS | Status: AC
Start: 2023-09-06 — End: 2023-09-06
  Administered 2023-09-06: 2 via TOPICAL

## 2023-09-06 MED ORDER — FENTANYL CITRATE (PF) 100 MCG/2ML IJ SOLN
INTRAMUSCULAR | Status: AC
  Filled 2023-09-06: qty 2

## 2023-09-06 MED ORDER — HYDROMORPHONE HCL 1 MG/ML IJ SOLN
0.2500 mg | INTRAMUSCULAR | Status: DC | PRN
  Administered 2023-09-06: 0.25 mg via INTRAVENOUS

## 2023-09-06 MED ORDER — ONDANSETRON HCL 4 MG/2ML IJ SOLN: 4.0000 mg | Freq: Four times a day (QID) | INTRAMUSCULAR | Status: DC | PRN

## 2023-09-06 MED ORDER — TRANEXAMIC ACID-NACL 1000-0.7 MG/100ML-% IV SOLN
1000.0000 mg | INTRAVENOUS | Status: AC
  Administered 2023-09-06: 1000 mg via INTRAVENOUS
  Filled 2023-09-06: qty 100

## 2023-09-06 MED ORDER — OXYCODONE HCL 5 MG PO TABS
5.0000 mg | ORAL_TABLET | ORAL | Status: DC | PRN
  Administered 2023-09-06: 5 mg via ORAL

## 2023-09-06 MED ORDER — BUPIVACAINE-EPINEPHRINE 0.25% -1:200000 IJ SOLN
INTRAMUSCULAR | Status: AC
  Filled 2023-09-06: qty 1

## 2023-09-06 MED ORDER — PHENYLEPHRINE HCL-NACL 20-0.9 MG/250ML-% IV SOLN
INTRAVENOUS | Status: DC | PRN
  Administered 2023-09-06: 30 ug/min via INTRAVENOUS

## 2023-09-06 MED ORDER — ASPIRIN 81 MG PO TBEC: 81.0000 mg | DELAYED_RELEASE_TABLET | Freq: Two times a day (BID) | ORAL | Status: AC

## 2023-09-06 MED ORDER — OXYCODONE HCL 5 MG PO TABS
ORAL_TABLET | ORAL | Status: AC
  Administered 2023-09-06: 5 mg via ORAL
  Filled 2023-09-06: qty 1

## 2023-09-06 MED ORDER — ORAL CARE MOUTH RINSE: 15.0000 mL | Freq: Once | OROMUCOSAL | Status: AC

## 2023-09-06 MED ORDER — OXYCODONE HCL 5 MG PO TABS
5.0000 mg | ORAL_TABLET | ORAL | 0 refills | Status: AC | PRN
Start: 2023-09-06 — End: 2023-09-13

## 2023-09-06 MED ORDER — CEFAZOLIN SODIUM-DEXTROSE 2-4 GM/100ML-% IV SOLN: 2.0000 g | Freq: Four times a day (QID) | INTRAVENOUS | Status: DC

## 2023-09-06 MED ORDER — ACETAMINOPHEN 500 MG PO TABS
ORAL_TABLET | ORAL | Status: AC
  Filled 2023-09-06: qty 2

## 2023-09-06 MED ORDER — LACTATED RINGERS IV BOLUS
500.0000 mL | Freq: Once | INTRAVENOUS | Status: AC
Start: 2023-09-06 — End: 2023-09-06
  Administered 2023-09-06: 500 mL via INTRAVENOUS

## 2023-09-06 MED ORDER — ONDANSETRON HCL 4 MG PO TABS: 4.0000 mg | ORAL_TABLET | Freq: Three times a day (TID) | ORAL | 0 refills | Status: AC | PRN

## 2023-09-06 MED ORDER — DEXAMETHASONE SODIUM PHOSPHATE 10 MG/ML IJ SOLN: 8.0000 mg | Freq: Once | INTRAMUSCULAR | Status: DC

## 2023-09-06 MED ORDER — METHOCARBAMOL 1000 MG/10ML IJ SOLN: 500.0000 mg | Freq: Four times a day (QID) | INTRAMUSCULAR | Status: DC | PRN

## 2023-09-06 MED ORDER — HYDROMORPHONE HCL 1 MG/ML IJ SOLN: 0.5000 mg | INTRAMUSCULAR | Status: DC | PRN

## 2023-09-06 MED ORDER — METHOCARBAMOL 500 MG PO TABS
500.0000 mg | ORAL_TABLET | Freq: Four times a day (QID) | ORAL | Status: DC | PRN
  Administered 2023-09-06: 500 mg via ORAL

## 2023-09-06 MED ORDER — PROPOFOL 500 MG/50ML IV EMUL
INTRAVENOUS | Status: DC | PRN
Start: 2023-09-06 — End: 2023-09-06
  Administered 2023-09-06: 85 ug/kg/min via INTRAVENOUS

## 2023-09-06 MED ORDER — CHLORHEXIDINE GLUCONATE 0.12 % MT SOLN
15.0000 mL | Freq: Once | OROMUCOSAL | Status: AC
  Administered 2023-09-06: 15 mL via OROMUCOSAL

## 2023-09-06 MED ORDER — OXYCODONE HCL 5 MG PO TABS
ORAL_TABLET | ORAL | Status: DC
Start: 2023-09-06 — End: 2023-09-06
  Filled 2023-09-06: qty 1

## 2023-09-06 MED ORDER — BUPIVACAINE IN DEXTROSE 0.75-8.25 % IT SOLN
INTRATHECAL | Status: DC | PRN
  Administered 2023-09-06: 1.6 mL via INTRATHECAL

## 2023-09-06 MED ORDER — METHOCARBAMOL 500 MG PO TABS
ORAL_TABLET | ORAL | Status: AC
  Filled 2023-09-06: qty 1

## 2023-09-06 MED ORDER — BUPIVACAINE LIPOSOME 1.3 % IJ SUSP: 20.0000 mL | Freq: Once | INTRAMUSCULAR | Status: DC

## 2023-09-06 MED ORDER — ROPIVACAINE HCL 7.5 MG/ML IJ SOLN
INTRAMUSCULAR | Status: DC | PRN
  Administered 2023-09-06: 20 mL via PERINEURAL

## 2023-09-06 MED ORDER — LACTATED RINGERS IV BOLUS: 250.0000 mL | Freq: Once | INTRAVENOUS | Status: DC

## 2023-09-06 MED ORDER — ACETAMINOPHEN 500 MG PO TABS: 1000.0000 mg | ORAL_TABLET | Freq: Three times a day (TID) | ORAL | Status: AC | PRN

## 2023-09-06 MED ORDER — SODIUM CHLORIDE (PF) 0.9 % IJ SOLN
INTRAMUSCULAR | Status: AC
  Filled 2023-09-06: qty 50

## 2023-09-06 MED ORDER — WATER FOR IRRIGATION, STERILE IR SOLN
Status: DC | PRN
  Administered 2023-09-06: 1000 mL

## 2023-09-06 MED ORDER — PROPOFOL 1000 MG/100ML IV EMUL
INTRAVENOUS | Status: AC
  Filled 2023-09-06: qty 100

## 2023-09-06 MED ORDER — METHOCARBAMOL 500 MG PO TABS
500.0000 mg | ORAL_TABLET | Freq: Three times a day (TID) | ORAL | 0 refills | Status: AC | PRN
Start: 2023-09-06 — End: 2023-09-16

## 2023-09-06 MED ORDER — ONDANSETRON HCL 4 MG PO TABS: 4.0000 mg | ORAL_TABLET | Freq: Four times a day (QID) | ORAL | Status: DC | PRN

## 2023-09-06 MED ORDER — LACTATED RINGERS IV SOLN: INTRAVENOUS | Status: DC

## 2023-09-06 MED ORDER — CEFAZOLIN SODIUM-DEXTROSE 2-4 GM/100ML-% IV SOLN
2.0000 g | INTRAVENOUS | Status: AC
  Administered 2023-09-06: 2 g via INTRAVENOUS
  Filled 2023-09-06: qty 100

## 2023-09-06 MED ORDER — BUPIVACAINE LIPOSOME 1.3 % IJ SUSP
INTRAMUSCULAR | Status: AC
  Filled 2023-09-06: qty 20

## 2023-09-06 MED ORDER — HYDROMORPHONE HCL 1 MG/ML IJ SOLN
INTRAMUSCULAR | Status: AC
  Filled 2023-09-06: qty 1

## 2023-09-06 MED ORDER — MIDAZOLAM HCL 2 MG/2ML IJ SOLN
INTRAMUSCULAR | Status: AC
  Filled 2023-09-06: qty 2

## 2023-09-06 MED ORDER — 0.9 % SODIUM CHLORIDE (POUR BTL) OPTIME
TOPICAL | Status: DC | PRN
  Administered 2023-09-06: 1000 mL

## 2023-09-06 MED ORDER — SODIUM CHLORIDE (PF) 0.9 % IJ SOLN
INTRAMUSCULAR | Status: DC | PRN
  Administered 2023-09-06: 80 mL

## 2023-09-06 MED ORDER — PHENYLEPHRINE HCL-NACL 20-0.9 MG/250ML-% IV SOLN
INTRAVENOUS | Status: AC
Start: 2023-09-06 — End: 2023-09-06
  Filled 2023-09-06: qty 250

## 2023-09-06 MED ORDER — ISOPROPYL ALCOHOL 70 % SOLN
Status: DC | PRN
  Administered 2023-09-06: 1 via TOPICAL

## 2023-09-06 MED ORDER — ACETAMINOPHEN 500 MG PO TABS
1000.0000 mg | ORAL_TABLET | Freq: Four times a day (QID) | ORAL | Status: DC
  Administered 2023-09-06: 1000 mg via ORAL

## 2023-09-06 MED ORDER — SODIUM CHLORIDE 0.9 % IV SOLN: INTRAVENOUS | Status: DC

## 2023-09-06 MED ORDER — KETOROLAC TROMETHAMINE 15 MG/ML IJ SOLN: 7.5000 mg | Freq: Four times a day (QID) | INTRAMUSCULAR | Status: DC

## 2023-09-06 MED ORDER — FENTANYL CITRATE (PF) 100 MCG/2ML IJ SOLN
INTRAMUSCULAR | Status: DC | PRN
  Administered 2023-09-06: 25 ug via INTRAVENOUS
  Administered 2023-09-06: 50 ug via INTRAVENOUS
  Administered 2023-09-06: 25 ug via INTRAVENOUS

## 2023-09-06 MED ORDER — SODIUM CHLORIDE 0.9 % IR SOLN
Status: DC | PRN
Start: 2023-09-06 — End: 2023-09-06
  Administered 2023-09-06: 1000 mL

## 2023-09-06 MED ORDER — DEXAMETHASONE SODIUM PHOSPHATE 10 MG/ML IJ SOLN
INTRAMUSCULAR | Status: DC | PRN
  Administered 2023-09-06: 10 mg

## 2023-09-06 SURGICAL SUPPLY — 56 items
BAG COUNTER SPONGE SURGICOUNT (BAG) IMPLANT
BLADE SAG 18X100X1.27 (BLADE) ×1 IMPLANT
BLADE SAW SAG 35X64 .89 (BLADE) ×1 IMPLANT
BLADE SAW SGTL 11.0X1.19X90.0M (BLADE) IMPLANT
BNDG COHESIVE 3X5 TAN ST LF (GAUZE/BANDAGES/DRESSINGS) ×1 IMPLANT
BNDG ELASTIC 6X10 VLCR STRL LF (GAUZE/BANDAGES/DRESSINGS) ×1 IMPLANT
BOWL SMART MIX CTS (DISPOSABLE) ×1 IMPLANT
CEMENT BONE R 1X40 (Cement) IMPLANT
CEMENT BONE REFOBACIN R1X40 US (Cement) IMPLANT
CHLORAPREP W/TINT 26 (MISCELLANEOUS) ×2 IMPLANT
COMP MED POLY AS PERS S6-7 12 (Joint) ×1 IMPLANT
COMPONENT MED PLY PERSS6-7 12 (Joint) IMPLANT
COVER SURGICAL LIGHT HANDLE (MISCELLANEOUS) ×1 IMPLANT
CUFF TRNQT CYL 34X4.125X (TOURNIQUET CUFF) ×1 IMPLANT
DERMABOND ADVANCED .7 DNX12 (GAUZE/BANDAGES/DRESSINGS) ×1 IMPLANT
DRAPE INCISE IOBAN 85X60 (DRAPES) ×1 IMPLANT
DRAPE SHEET LG 3/4 BI-LAMINATE (DRAPES) ×1 IMPLANT
DRAPE U-SHAPE 47X51 STRL (DRAPES) ×1 IMPLANT
DRSG AQUACEL AG ADV 3.5X10 (GAUZE/BANDAGES/DRESSINGS) ×1 IMPLANT
ELECT REM PT RETURN 15FT ADLT (MISCELLANEOUS) ×1 IMPLANT
FEMUR CMT CR STD SZ 6 LT KNEE (Joint) ×1 IMPLANT
FEMUR CMTD CR STD SZ 6 LT KNEE (Joint) IMPLANT
GAUZE SPONGE 4X4 12PLY STRL (GAUZE/BANDAGES/DRESSINGS) ×1 IMPLANT
GLOVE BIO SURGEON STRL SZ 6.5 (GLOVE) ×2 IMPLANT
GLOVE BIOGEL PI IND STRL 6.5 (GLOVE) ×1 IMPLANT
GLOVE BIOGEL PI IND STRL 8 (GLOVE) ×1 IMPLANT
GLOVE SURG ORTHO 8.0 STRL STRW (GLOVE) ×2 IMPLANT
GOWN STRL REUS W/ TWL XL LVL3 (GOWN DISPOSABLE) ×2 IMPLANT
HOLDER FOLEY CATH W/STRAP (MISCELLANEOUS) ×1 IMPLANT
HOOD PEEL AWAY T7 (MISCELLANEOUS) ×3 IMPLANT
KIT TURNOVER KIT A (KITS) IMPLANT
MANIFOLD NEPTUNE II (INSTRUMENTS) ×1 IMPLANT
MARKER SKIN DUAL TIP RULER LAB (MISCELLANEOUS) ×1 IMPLANT
NS IRRIG 1000ML POUR BTL (IV SOLUTION) ×1 IMPLANT
PACK TOTAL KNEE CUSTOM (KITS) ×1 IMPLANT
PIN DRILL HDLS TROCAR 75 4PK (PIN) IMPLANT
SCREW HEADED 33MM KNEE (MISCELLANEOUS) IMPLANT
SET HNDPC FAN SPRY TIP SCT (DISPOSABLE) ×1 IMPLANT
SOLUTION IRRIG SURGIPHOR (IV SOLUTION) IMPLANT
SPIKE FLUID TRANSFER (MISCELLANEOUS) ×1 IMPLANT
STEM POLY PAT PLY 29M KNEE (Knees) IMPLANT
STEM TIB ST PERS 14+30 (Stem) IMPLANT
STEM TIBIA 5 DEG SZ D L KNEE (Knees) IMPLANT
STRIP CLOSURE SKIN 1/2X4 (GAUZE/BANDAGES/DRESSINGS) ×1 IMPLANT
SUT MNCRL AB 3-0 PS2 18 (SUTURE) ×1 IMPLANT
SUT STRATAFIX 0 PDS 27 VIOLET (SUTURE) ×1
SUT STRATAFIX 14 PDO 48 VLT (SUTURE) ×1 IMPLANT
SUT STRATAFIX PDO 1 14 VIOLET (SUTURE) ×1
SUT VIC AB 2-0 CT2 27 (SUTURE) ×2 IMPLANT
SUTURE STRATFX 0 PDS 27 VIOLET (SUTURE) ×1 IMPLANT
SYR 50ML LL SCALE MARK (SYRINGE) ×1 IMPLANT
TIBIA STEM 5 DEG SZ D L KNEE (Knees) ×1 IMPLANT
TRAY FOLEY MTR SLVR 14FR STAT (SET/KITS/TRAYS/PACK) IMPLANT
TUBE SUCTION HIGH CAP CLEAR NV (SUCTIONS) ×1 IMPLANT
UNDERPAD 30X36 HEAVY ABSORB (UNDERPADS AND DIAPERS) ×1 IMPLANT
WRAP KNEE MAXI GEL POST OP (GAUZE/BANDAGES/DRESSINGS) IMPLANT

## 2023-09-06 NOTE — Interval H&P Note (Signed)

## 2023-09-06 NOTE — Anesthesia Procedure Notes (Signed)
Spinal  Patient location during procedure: OR Start time: 09/06/2023 8:31 AM End time: 09/06/2023 8:33 AM Staffing Performed: anesthesiologist  Anesthesiologist: Atilano Median, DO Performed by: Atilano Median, DO Authorized by: Atilano Median, DO   Preanesthetic Checklist Completed: patient identified, IV checked, site marked, risks and benefits discussed, surgical consent, monitors and equipment checked, pre-op evaluation and timeout performed Spinal Block Patient position: sitting Prep: DuraPrep Patient monitoring: heart rate, cardiac monitor, continuous pulse ox and blood pressure Approach: midline Location: L4-5 Injection technique: single-shot Needle Needle type: Pencan  Needle gauge: 24 G Needle length: 10 cm Assessment Events: CSF return Additional Notes Patient identified. Risks/Benefits/Options discussed with patient including but not limited to bleeding, infection, nerve damage, paralysis, failed block, incomplete pain control, headache, blood pressure changes, nausea, vomiting, reactions to medications, itching and postpartum back pain. Confirmed with bedside nurse the patient's most recent platelet count. Confirmed with patient that they are not currently taking any anticoagulation, have any bleeding history or any family history of bleeding disorders. Patient expressed understanding and wished to proceed. All questions were answered. Sterile technique was used throughout the entire procedure. Please see nursing notes for vital signs. Warning signs of high block given to the patient including shortness of breath, tingling/numbness in hands, complete motor block, or any concerning symptoms with instructions to call for help. Patient was given instructions on fall risk and not to get out of bed. All questions and concerns addressed with instructions to call with any issues or inadequate analgesia.

## 2023-09-06 NOTE — Transfer of Care (Signed)
Immediate Anesthesia Transfer of Care Note  Patient: Crystal Spears  Procedure(s) Performed: TOTAL KNEE ARTHROPLASTY (Left: Knee)  Patient Location: PACU  Anesthesia Type:Spinal  Level of Consciousness: awake and alert   Airway & Oxygen Therapy: Patient Spontanous Breathing and Patient connected to face mask oxygen  Post-op Assessment: Report given to RN and Post -op Vital signs reviewed and stable  Post vital signs: Reviewed and stable  Last Vitals:  Vitals Value Taken Time  BP 140/66 09/06/23 1023  Temp    Pulse 69 09/06/23 1025  Resp 18 09/06/23 1025  SpO2 100 % 09/06/23 1025  Vitals shown include unfiled device data.  Last Pain:  Vitals:   09/06/23 0726  TempSrc:   PainSc: 0-No pain         Complications: No notable events documented.

## 2023-09-06 NOTE — Evaluation (Addendum)
Physical Therapy Evaluation Patient Details Name: Crystal Spears MRN: [id number] DOB: Sep 29, 1940 Today's Date: 09/06/2023  History of Present Illness  83 yo female presents to therapy s/p L TKA on 2./19/2025 due to failure of conservative measures. 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  Clinical Impression    Crystal Spears is a 83 y.o. female POD 0 s/p L TKA. Patient reports IND with mobility at baseline. Patient is now limited by functional impairments (see PT problem list below) and requires CGA and cues for transfers and gait with RW. Patient was able to ambulate 50 feet x 2  with RW and CGA and cues for safe walker management. Patient educated on safe sequencing for stair mobility with one handrail and use of SPC, fall risk prevention, slowly increasing activity level, pain management and goal and use of CP/ice pt  and spouse  verbalized understanding of safe guarding position for people assisting with mobility. Patient instructed in exercises to facilitate ROM and circulation reviewed and HO provided. Pt demonstrated difficulty with quad contraction--SLR, SAQ and quad sets however no L LE instability noted with functional mobility tasks. Pt reported feeling light headed following gait tasks, Bp as below during PT eval. Nursing provided pt with fluids and completed orthostatic assessment following PT with positive findings. Patient will benefit from continued skilled PT interventions to address impairments and progress towards PLOF. Patient has met mobility goals at adequate level for discharge home pending nursing recommendations with family support and OPPT services scheduled for 2/24; will continue to follow if pt continues acute stay to progress towards Mod I goals. Bp semi reclined at rest 180/80 Bp seated EOB 183/73 Bp standing 172/96 no reports of light headedness  Bp s/p reports of feeling  light headed and in reclined position in chair 182/82      If plan is discharge home, recommend the following: A little help with walking and/or transfers;A little help with bathing/dressing/bathroom;Assistance with cooking/housework;Assist for transportation;Help with stairs or ramp for entrance   Can travel by private vehicle        Equipment Recommendations None recommended by PT  Recommendations for Other Services       Functional Status Assessment Patient has had a recent decline in their functional status and demonstrates the ability to make significant improvements in function in a reasonable and predictable amount of time.     Precautions / Restrictions Precautions Precautions: Knee;Fall Restrictions Weight Bearing Restrictions Per Provider Order: No      Mobility  Bed Mobility Overal bed mobility: Needs Assistance Bed Mobility: Supine to Sit     Supine to sit: Contact guard, HOB elevated, Used rails     General bed mobility comments: min cues, pt ed provided on use of gait belt as a leg lifter if needbe in home setting    Transfers Overall transfer level: Needs assistance Equipment used: Rolling walker (2 wheels) Transfers: Sit to/from Stand Sit to Stand: Contact guard assist           General transfer comment: min cues for proper UE and AD placement with bed, recliner and commode transfer    Ambulation/Gait Ambulation/Gait assistance: Contact guard assist Gait Distance (Feet): 50 Feet Assistive device: Rolling walker (2 wheels) Gait Pattern/deviations: Step-to pattern, Antalgic, Trunk flexed Gait velocity: decreased     General Gait Details: slight flexed posture with B UE support at RW to offload R  LE in stance phase with min cues for RW management  Stairs Stairs: Yes Stairs assistance: Contact guard assist Stair Management: Two rails Number of Stairs: 3 General stair comments: min cues for safety and sequening with step to pattern and pt  able to progress to use of SPC and handrail  Wheelchair Mobility     Tilt Bed    Modified Rankin (Stroke Patients Only)       Balance Overall balance assessment: Needs assistance Sitting-balance support: Feet supported Sitting balance-Leahy Scale: Good     Standing balance support: Bilateral upper extremity supported, During functional activity, Reliant on assistive device for balance Standing balance-Leahy Scale: Poor                               Pertinent Vitals/Pain Pain Assessment Pain Assessment: 0-10 Pain Score: 8  (no pain at rest and 8/10 with mobility) Pain Location: L knee and LE Pain Descriptors / Indicators: Aching, Dull, Discomfort, Operative site guarding Pain Intervention(s): Limited activity within patient's tolerance, Monitored during session, Premedicated before session, Repositioned, Ice applied    Home Living Family/patient expects to be discharged to:: Private residence Living Arrangements: Spouse/significant other Available Help at Discharge: Family Type of Home: House Home Access: Stairs to enter Entrance Stairs-Rails: Right Entrance Stairs-Number of Steps: 3   Home Layout: One level Home Equipment: Agricultural consultant (2 wheels);Cane - single point      Prior Function Prior Level of Function : Independent/Modified Independent             Mobility Comments: IND no AD for all ADLs self care tasks, IADLs       Extremity/Trunk Assessment        Lower Extremity Assessment Lower Extremity Assessment: LLE deficits/detail LLE Deficits / Details: ankle DF/PF 5/5; AA for SLR, pt reports not being able to perform SLR prior to Surgery and R LE weakness LLE Sensation: WNL    Cervical / Trunk Assessment Cervical / Trunk Assessment: Normal  Communication   Communication Communication: No apparent difficulties    Cognition Arousal: Alert Behavior During Therapy: WFL for tasks assessed/performed   PT - Cognitive impairments:  No apparent impairments                         Following commands: Intact       Cueing       General Comments      Exercises Total Joint Exercises Ankle Circles/Pumps: AROM, Both, 10 reps Quad Sets: AROM, Left, 5 reps Short Arc Quad: Left, 5 reps, AAROM Heel Slides: AROM, Left, 5 reps Hip ABduction/ADduction: AROM, Left, 5 reps Straight Leg Raises: Left, 5 reps, AAROM Knee Flexion: AROM, Left, 5 reps, Seated   Assessment/Plan    PT Assessment Patient needs continued PT services  PT Problem List Decreased strength;Decreased range of motion;Decreased activity tolerance;Decreased balance;Decreased mobility;Decreased coordination;Pain       PT Treatment Interventions DME instruction;Gait training;Stair training;Functional mobility training;Therapeutic activities;Therapeutic exercise;Balance training;Neuromuscular re-education;Patient/family education;Modalities    PT Goals (Current goals can be found in the Care Plan section)  Acute Rehab PT Goals Patient Stated Goal: to be able to walk without pain and AD PT Goal Formulation: With patient Time For Goal Achievement: 09/18/23 Potential to Achieve Goals: Good    Frequency 7X/week     Co-evaluation               AM-PAC PT "6 Clicks" Mobility  Outcome Measure Help needed turning from your back to your side while in a flat bed without using bedrails?: A Little Help needed moving from lying on your back to sitting on the side of a flat bed without using bedrails?: A Little Help needed moving to and from a bed to a chair (including a wheelchair)?: A Little Help needed standing up from a chair using your arms (e.g., wheelchair or bedside chair)?: A Little Help needed to walk in hospital room?: A Little Help needed climbing 3-5 steps with a railing? : A Little 6 Click Score: 18    End of Session Equipment Utilized During Treatment: Gait belt Activity Tolerance: Patient limited by pain;Other (comment)  (reports of feeling light headed) Patient left: in chair;with call bell/phone within reach;with family/visitor present Nurse Communication: Mobility status;Other (comment) (pt feeling light headed, however able to complete all mobility tasks prior to report and from PT functional standpoint ready for d/c home, nursing to determine if pt medically stable) PT Visit Diagnosis: Unsteadiness on feet (R26.81);Other abnormalities of gait and mobility (R26.89);Muscle weakness (generalized) (M62.81);Difficulty in walking, not elsewhere classified (R26.2);Pain Pain - Right/Left: Left Pain - part of body: Knee;Leg    Time: 0454-0981 PT Time Calculation (min) (ACUTE ONLY): 61 min   Charges:   PT Evaluation $PT Eval Low Complexity: 1 Low PT Treatments $Gait Training: 8-22 mins $Therapeutic Exercise: 8-22 mins $Therapeutic Activity: 8-22 mins PT General Charges $$ ACUTE PT VISIT: 1 Visit         Johnny Bridge, PT Acute Rehab   Jacqualyn Posey 09/06/2023, 3:48 PM

## 2023-09-06 NOTE — Discharge Instructions (Signed)
INSTRUCTIONS AFTER JOINT REPLACEMENT  ° °Remove items at home which could result in a fall. This includes throw rugs or furniture in walking pathways °ICE to the affected joint every three hours while awake for 30 minutes at a time, for at least the first 3-5 days, and then as needed for pain and swelling.  Continue to use ice for pain and swelling. You may notice swelling that will progress down to the foot and ankle.  This is normal after surgery.  Elevate your leg when you are not up walking on it.   °Continue to use the breathing machine you got in the hospital (incentive spirometer) which will help keep your temperature down.  It is common for your temperature to cycle up and down following surgery, especially at night when you are not up moving around and exerting yourself.  The breathing machine keeps your lungs expanded and your temperature down. ° ° °DIET:  As you were doing prior to hospitalization, we recommend a well-balanced diet. ° °DRESSING / WOUND CARE / SHOWERING ° °Keep the surgical dressing until follow up.  The dressing is water proof, so you can shower without any extra covering.  IF THE DRESSING FALLS OFF or the wound gets wet inside, change the dressing with sterile gauze.  Please use good hand washing techniques before changing the dressing.  Do not use any lotions or creams on the incision until instructed by your surgeon.   ° °ACTIVITY ° °Increase activity slowly as tolerated, but follow the weight bearing instructions below.   °No driving for 6 weeks or until further direction given by your physician.  You cannot drive while taking narcotics.  °No lifting or carrying greater than 10 lbs. until further directed by your surgeon. °Avoid periods of inactivity such as sitting longer than an hour when not asleep. This helps prevent blood clots.  °You may return to work once you are authorized by your doctor.  ° ° ° °WEIGHT BEARING  ° °Weight bearing as tolerated with assist device (walker, cane,  etc) as directed, use it as long as suggested by your surgeon or therapist, typically at least 4-6 weeks. ° ° °EXERCISES ° °Results after joint replacement surgery are often greatly improved when you follow the exercise, range of motion and muscle strengthening exercises prescribed by your doctor. Safety measures are also important to protect the joint from further injury. Any time any of these exercises cause you to have increased pain or swelling, decrease what you are doing until you are comfortable again and then slowly increase them. If you have problems or questions, call your caregiver or physical therapist for advice.  ° °Rehabilitation is important following a joint replacement. After just a few days of immobilization, the muscles of the leg can become weakened and shrink (atrophy).  These exercises are designed to build up the tone and strength of the thigh and leg muscles and to improve motion. Often times heat used for twenty to thirty minutes before working out will loosen up your tissues and help with improving the range of motion but do not use heat for the first two weeks following surgery (sometimes heat can increase post-operative swelling).  ° °These exercises can be done on a training (exercise) mat, on the floor, on a table or on a bed. Use whatever works the best and is most comfortable for you.    Use music or television while you are exercising so that the exercises are a pleasant break in your   day. This will make your life better with the exercises acting as a break in your routine that you can look forward to.   Perform all exercises about fifteen times, three times per day or as directed.  You should exercise both the operative leg and the other leg as well.  Exercises include:   Quad Sets - Tighten up the muscle on the front of the thigh (Quad) and hold for 5-10 seconds.   Straight Leg Raises - With your knee straight (if you were given a brace, keep it on), lift the leg to 60  degrees, hold for 3 seconds, and slowly lower the leg.  Perform this exercise against resistance later as your leg gets stronger.  Leg Slides: Lying on your back, slowly slide your foot toward your buttocks, bending your knee up off the floor (only go as far as is comfortable). Then slowly slide your foot back down until your leg is flat on the floor again.  Angel Wings: Lying on your back spread your legs to the side as far apart as you can without causing discomfort.  Hamstring Strength:  Lying on your back, push your heel against the floor with your leg straight by tightening up the muscles of your buttocks.  Repeat, but this time bend your knee to a comfortable angle, and push your heel against the floor.  You may put a pillow under the heel to make it more comfortable if necessary.   A rehabilitation program following joint replacement surgery can speed recovery and prevent re-injury in the future due to weakened muscles. Contact your doctor or a physical therapist for more information on knee rehabilitation.    CONSTIPATION  Constipation is defined medically as fewer than three stools per week and severe constipation as less than one stool per week.  Even if you have a regular bowel pattern at home, your normal regimen is likely to be disrupted due to multiple reasons following surgery.  Combination of anesthesia, postoperative narcotics, change in appetite and fluid intake all can affect your bowels.   YOU MUST use at least one of the following options; they are listed in order of increasing strength to get the job done.  They are all available over the counter, and you may need to use some, POSSIBLY even all of these options:    Drink plenty of fluids (prune juice may be helpful) and high fiber foods Colace 100 mg by mouth twice a day  Senokot for constipation as directed and as needed Dulcolax (bisacodyl), take with full glass of water  Miralax (polyethylene glycol) once or twice a day as  needed.  If you have tried all these things and are unable to have a bowel movement in the first 3-4 days after surgery call either your surgeon or your primary doctor.    If you experience loose stools or diarrhea, hold the medications until you stool forms back up.  If your symptoms do not get better within 1 week or if they get worse, check with your doctor.  If you experience "the worst abdominal pain ever" or develop nausea or vomiting, please contact the office immediately for further recommendations for treatment.   ITCHING:  If you experience itching with your medications, try taking only a single pain pill, or even half a pain pill at a time.  You can also use Benadryl over the counter for itching or also to help with sleep.   TED HOSE STOCKINGS:  Use stockings on both  legs until for at least 2 weeks or as directed by physician office. They may be removed at night for sleeping.  MEDICATIONS:  See your medication summary on the After Visit Summary that nursing will review with you.  You may have some home medications which will be placed on hold until you complete the course of blood thinner medication.  It is important for you to complete the blood thinner medication as prescribed.   Blood clot prevention (DVT Prophylaxis): After surgery you are at an increased risk for a blood clot. you were prescribed a blood thinner, Aspirin '81mg'$ , to be taken twice daily for a total of 4 weeks from surgery to help reduce your risk of getting a blood clot. This will help prevent a blood clot. Signs of a pulmonary embolus (blood clot in the lungs) include sudden short of breath, feeling lightheaded or dizzy, chest pain with a deep breath, rapid pulse rapid breathing. Signs of a blood clot in your arms or legs include new unexplained swelling and cramping, warm, red or darkened skin around the painful area. Please call the office or 911 right away if these signs or symptoms develop.  PRECAUTIONS:  If you  experience chest pain or shortness of breath - call 911 immediately for transfer to the hospital emergency department.   If you develop a fever greater that 101 F, purulent drainage from wound, increased redness or drainage from wound, foul odor from the wound/dressing, or calf pain - CONTACT YOUR SURGEON.                                                   FOLLOW-UP APPOINTMENTS:  If you do not already have a post-op appointment, please call the office for an appointment to be seen by your surgeon.  Guidelines for how soon to be seen are listed in your After Visit Summary, but are typically between 2-3 weeks after surgery.  OTHER INSTRUCTIONS:   Knee Replacement:  Do not place pillow under knee, focus on keeping the knee straight while resting.   POST-OPERATIVE OPIOID TAPER INSTRUCTIONS: It is important to wean off of your opioid medication as soon as possible. If you do not need pain medication after your surgery it is ok to stop day one. Opioids include: Codeine, Hydrocodone(Norco, Vicodin), Oxycodone(Percocet, oxycontin) and hydromorphone amongst others.  Long term and even short term use of opiods can cause: Increased pain response Dependence Constipation Depression Respiratory depression And more.  Withdrawal symptoms can include Flu like symptoms Nausea, vomiting And more Techniques to manage these symptoms Hydrate well Eat regular healthy meals Stay active Use relaxation techniques(deep breathing, meditating, yoga) Do Not substitute Alcohol to help with tapering If you have been on opioids for less than two weeks and do not have pain than it is ok to stop all together.  Plan to wean off of opioids This plan should start within one week post op of your joint replacement. Maintain the same interval or time between taking each dose and first decrease the dose.  Cut the total daily intake of opioids by one tablet each day Next start to increase the time between doses. The  last dose that should be eliminated is the evening dose.   MAKE SURE YOU:  Understand these instructions.  Get help right away if you are not doing well or get  worse.    Thank you for letting us be a part of your medical care team.  It is a privilege we respect greatly.  We hope these instructions will help you stay on track for a fast and full recovery!

## 2023-09-06 NOTE — Progress Notes (Signed)
Orthopedic Tech Progress Note Patient Details:  EYLIN PONTARELLI Sep 05, 1940 [id number]  Ortho Devices Type of Ortho Device: Bone foam zero knee Ortho Device/Splint Location: left Ortho Device/Splint Interventions: Ordered, Application, Adjustment   Post Interventions Patient Tolerated: Well Instructions Provided: Adjustment of device, Care of device  Kizzie Fantasia 09/06/2023, 10:49 AM

## 2023-09-06 NOTE — Anesthesia Procedure Notes (Signed)
Anesthesia Regional Block: Adductor canal block   Pre-Anesthetic Checklist: , timeout performed,  Correct Patient, Correct Site, Correct Laterality,  Correct Procedure, Correct Position, site marked,  Risks and benefits discussed,  Surgical consent,  Pre-op evaluation,  At surgeon's request and post-op pain management  Laterality: Left  Prep: Dura Prep       Needles:  Injection technique: Single-shot  Needle Type: Echogenic Stimulator Needle     Needle Length: 10cm  Needle Gauge: 20     Additional Needles:   Procedures:,,,, ultrasound used (permanent image in chart),,    Narrative:  Start time: 09/06/2023 8:15 AM End time: 09/06/2023 8:20 AM Injection made incrementally with aspirations every 5 mL.  Performed by: Personally  Anesthesiologist: Atilano Median, DO  Additional Notes: Patient identified. Risks/Benefits/Options discussed with patient including but not limited to bleeding, infection, nerve damage, failed block, incomplete pain control. Patient expressed understanding and wished to proceed. All questions were answered. Sterile technique was used throughout the entire procedure. Please see nursing notes for vital signs. Aspirated in 5cc intervals with injection for negative confirmation. Patient was given instructions on fall risk and not to get out of bed. All questions and concerns addressed with instructions to call with any issues or inadequate analgesia.

## 2023-09-06 NOTE — Anesthesia Postprocedure Evaluation (Signed)
Anesthesia Post Note  Patient: Crystal Spears  Procedure(s) Performed: TOTAL KNEE ARTHROPLASTY (Left: Knee)     Patient location during evaluation: PACU Anesthesia Type: Regional, MAC and Spinal Level of consciousness: awake and alert Pain management: pain level controlled Vital Signs Assessment: post-procedure vital signs reviewed and stable Respiratory status: spontaneous breathing, nonlabored ventilation, respiratory function stable and patient connected to nasal cannula oxygen Cardiovascular status: stable and blood pressure returned to baseline Postop Assessment: no apparent nausea or vomiting Anesthetic complications: no   No notable events documented.  Last Vitals:  Vitals:   09/06/23 1430 09/06/23 1500  BP: (!) 182/82 (!) 186/77  Pulse: 63 66  Resp: 12   Temp:    SpO2: 100%     Last Pain:  Vitals:   09/06/23 1430  TempSrc:   PainSc: 1                  Stepfanie Yott P Lakaisha Danish

## 2023-09-06 NOTE — Op Note (Signed)
DATE OF SURGERY:  09/06/2023 TIME: 9:58 AM  PATIENT NAME:  Crystal Spears   AGE: 83 y.o.    PRE-OPERATIVE DIAGNOSIS: End-stage left knee osteoarthritis  POST-OPERATIVE DIAGNOSIS:  Same  PROCEDURE: Cemented left total Knee Arthroplasty  SURGEON:  Maddisen Vought A Charlie Seda, MD   ASSISTANT: Darron Doom, RNFA, present and scrubbed throughout the case, critical for assistance with exposure, retraction, instrumentation, and closure.   OPERATIVE IMPLANTS:  Cemented Zimmer persona size 6 standard left CR femur, left size D tibial baseplate, 29 mm patella, 14 mm x 30 mm tibial stem extension, 12 mm MC poly insert Implant Name Type Inv. Item Serial No. Manufacturer Lot No. LRB No. Used Action  CEMENT BONE R 1X40 - [id number] Cement CEMENT BONE R 1X40  ZIMMER RECON(ORTH,TRAU,BIO,SG) [id number] Left 2 Implanted  FEMUR CMT CR STD SZ 6 LT KNEE - [id number] Joint FEMUR CMT CR STD SZ 6 LT KNEE  ZIMMER RECON(ORTH,TRAU,BIO,SG) [id number] Left 1 Implanted  STEM POLY PAT PLY 57M KNEE - [id number] Knees STEM POLY PAT PLY 57M KNEE  ZIMMER RECON(ORTH,TRAU,BIO,SG) [id number] Left 1 Implanted  TIBIA STEM 5 DEG SZ D L KNEE - [id number] Knees TIBIA STEM 5 DEG SZ D L KNEE  ZIMMER RECON(ORTH,TRAU,BIO,SG) [id number] Left 1 Implanted  STEM TIB ST PERS 14+30 - [id number] Stem STEM TIB ST PERS 14+30  ZIMMER RECON(ORTH,TRAU,BIO,SG) [id number] Left 1 Implanted  COMP MED POLY AS PERS S6-7 12 - [id number] Joint COMP MED POLY AS PERS S6-7 12  ZIMMER RECON(ORTH,TRAU,BIO,SG) [id number] Left 1 Implanted      PREOPERATIVE INDICATIONS:  Crystal Spears is a 83 y.o. year old female with end stage bone on bone degenerative arthritis of the knee who failed conservative treatment, including injections, antiinflammatories, activity modification, and assistive devices, and had significant impairment of their activities of daily living, and elected for Total Knee Arthroplasty.   The risks, benefits, and alternatives were discussed at length  including but not limited to the risks of infection, bleeding, nerve injury, stiffness, blood clots, the need for revision surgery, cardiopulmonary complications, among others, and they were willing to proceed.  ESTIMATED BLOOD LOSS: 50cc  OPERATIVE DESCRIPTION:   Once adequate anesthesia was induced, preoperative antibiotics, 2 gm of ancef,1 gm of Tranexamic Acid, and 8 mg of Decadron administered, the patient was positioned supine with a left thigh tourniquet placed.  The left lower extremity was prepped and draped in sterile fashion.  A time-  out was performed identifying the patient, planned procedure, and the appropriate extremity.     The leg was  exsanguinated, tourniquet elevated to 250 mmHg.  A midline incision was  made followed by median parapatellar arthrotomy. Anterior horn of the medial meniscus was released and resected. A medial release was performed, the infrapatellar fat pad was resected with care taken to protect the patellar tendon. The suprapatellar fat was removed to exposed the distal anterior femur. The anterior horn of the lateral meniscus and ACL were released.    Following initial  exposure, I first started with the femur  The femoral  canal was opened with a drill, canal was suctioned to try to prevent fat emboli.  An  intramedullary rod was passed set at 5 degrees valgus, 10 mm. The distal femur was resected.  Following this resection, the tibia was  subluxated anteriorly.  Using the extramedullary guide, 10mm of bone was resected off   the proximal lateral tibia.  We confirmed the gap would be  stable medially and laterally with a size 10mm  spacer block as well as confirmed that the tibial cut was perpendicular in the coronal plane, checking with an alignment rod.    Once this was done, the posterior femoral referencing femoral sizer was placed under to the posterior condyles with 5 degrees of external rotational which was parallel to the transepicondylar axis and  perpendicular to [employer]. The femur was sized to be a size 6 in the anterior-  posterior dimension. The  anterior, posterior, and  chamfer cuts were made without difficulty nor   notching making certain that I was along the anterior cortex to help  with flexion gap stability. Next a laminar spreader was placed with the knee in flexion and the medial lateral menisci were resected.  5 cc of the Exparel mixture was injected in the medial side of the back of the knee and 3 cc in the lateral side.  1/2 inch curved osteotome was used to resect posterior osteophyte that was then removed with a pituitary rongeur.       At this point, the tibia was sized to be a size D.  The size D tray was  then pinned in position. Trial reduction was now carried with a 6 femur, D tibia, a 10 mm MC insert.  There was medial laxity with millimeter insert that improved with the 12 mm insert. the knee had full extension and was stable to varus valgus stress in extension.  The knee was stable in flexion PCL was left intact  Attention was next directed to the patella.  Precut  measurement was noted to be 18 mm.  I resected down to 13 mm and used a  29 patellar button to restore patellar height as well as cover the cut surface.     The patella lug holes were drilled and a 29 mm patella poly trial was placed.    The knee was brought to full extension with good flexion stability with the patella tracking through the trochlea without application of pressure.     Next the femoral component was again assessed and determined to be seated and appropriately lateralized.  The femoral lug holes were drilled.  The femoral component was then removed. Tibial component was again assessed and felt to be seated and appropriately rotated with the medial third of the tubercle. The tibia was then drilled, and keel punched.  The tibial bone was relatively soft so elected for 30 mm stem extension on the tibia.   Final components were  opened  and cement was mixed.      Final implants were then  cemented onto cleaned and dried cut surfaces of bone with the knee brought to extension with a 12 mm MC poly.  The knee was irrigated with sterile Betadine diluted in saline as well as pulse lavage normal saline. The synovial lining was  then injected a dilute Exparel with 30cc of 0.25% marcaine with epinephrine.         Once the cement had fully cured, excess cement was removed throughout the knee.  I confirmed that I was satisfied with the range of motion and stability, and the final 12mm MC poly insert was chosen.  It was placed into the knee.         The tourniquet had been let down at 58 minutes.  No significant hemostasis was required.  The medial parapatellar arthrotomy was then reapproximated using #1 Stratafix sutures with the knee  in flexion.  The remaining wound was closed with 0 stratafix, 2-0  Vicryl, and running 3-0 Monocryl. The knee was cleaned, dried, dressed sterilely using Dermabond and   Aquacel dressing.  The patient was then brought to recovery room in stable condition, tolerating the procedure  well. There were no complications.   Post op recs: WB: WBAT Abx: ancef Imaging: PACU xrays DVT prophylaxis: Aspirin 81mg  BID x4 weeks Follow up: 2 weeks after surgery for a wound check with Dr. Blanchie Dessert at [facility].  Address: [address], [place], [place] [address]  Office Phone: [phone]  Weber Cooks, MD Orthopaedic Surgery

## 2023-09-07 ENCOUNTER — Encounter (HOSPITAL_COMMUNITY): Payer: Self-pay | Admitting: Orthopedic Surgery

## 2023-09-15 ENCOUNTER — Emergency Department (HOSPITAL_COMMUNITY)
Admission: EM | Admit: 2023-09-15 | Discharge: 2023-09-15 | Disposition: A | Discharge status: Home / Self Care | Payer: Medicare Other | Attending: Emergency Medicine | Admitting: Emergency Medicine

## 2023-09-15 ENCOUNTER — Emergency Department (HOSPITAL_COMMUNITY): Payer: Medicare Other

## 2023-09-15 ENCOUNTER — Other Ambulatory Visit: Payer: Self-pay

## 2023-09-15 DIAGNOSIS — R42 Dizziness and giddiness: Secondary | ICD-10-CM | POA: Insufficient documentation

## 2023-09-15 DIAGNOSIS — Z7982 Long term (current) use of aspirin: Secondary | ICD-10-CM | POA: Diagnosis not present

## 2023-09-15 LAB — CBC WITH DIFFERENTIAL/PLATELET
Abs Immature Granulocytes: 0.05 10*3/uL (ref 0.00–0.07)
Basophils Absolute: 0.1 10*3/uL (ref 0.0–0.1)
Basophils Relative: 1 %
Eosinophils Absolute: 0.1 10*3/uL (ref 0.0–0.5)
Eosinophils Relative: 1 %
HCT: 37.6 % (ref 36.0–46.0)
Hemoglobin: 12.5 g/dL (ref 12.0–15.0)
Immature Granulocytes: 1 %
Lymphocytes Relative: 13 %
Lymphs Abs: 1.1 10*3/uL (ref 0.7–4.0)
MCH: 31.1 pg (ref 26.0–34.0)
MCHC: 33.2 g/dL (ref 30.0–36.0)
MCV: 93.5 fL (ref 80.0–100.0)
Monocytes Absolute: 0.7 10*3/uL (ref 0.1–1.0)
Monocytes Relative: 8 %
Neutro Abs: 6.5 10*3/uL (ref 1.7–7.7)
Neutrophils Relative %: 76 %
Platelets: 401 10*3/uL — ABNORMAL HIGH (ref 150–400)
RBC: 4.02 MIL/uL (ref 3.87–5.11)
RDW: 13.5 % (ref 11.5–15.5)
WBC: 8.4 10*3/uL (ref 4.0–10.5)
nRBC: 0 % (ref 0.0–0.2)

## 2023-09-15 LAB — URINALYSIS, ROUTINE W REFLEX MICROSCOPIC
Bilirubin Urine: NEGATIVE
Glucose, UA: NEGATIVE mg/dL
Hgb urine dipstick: NEGATIVE
Ketones, ur: 20 mg/dL — AB
Leukocytes,Ua: NEGATIVE
Nitrite: NEGATIVE
Protein, ur: NEGATIVE mg/dL
Specific Gravity, Urine: 1.008 (ref 1.005–1.030)
pH: 7 (ref 5.0–8.0)

## 2023-09-15 LAB — COMPREHENSIVE METABOLIC PANEL
ALT: 15 U/L (ref 0–44)
AST: 15 U/L (ref 15–41)
Albumin: 3.3 g/dL — ABNORMAL LOW (ref 3.5–5.0)
Alkaline Phosphatase: 152 U/L — ABNORMAL HIGH (ref 38–126)
Anion gap: 11 (ref 5–15)
BUN: 14 mg/dL (ref 8–23)
CO2: 23 mmol/L (ref 22–32)
Calcium: 9.2 mg/dL (ref 8.9–10.3)
Chloride: 105 mmol/L (ref 98–111)
Creatinine, Ser: 0.73 mg/dL (ref 0.44–1.00)
GFR, Estimated: >60 mL/min (ref >60)
Glucose, Bld: 104 mg/dL — ABNORMAL HIGH (ref 70–99)
Potassium: 3.6 mmol/L (ref 3.5–5.1)
Sodium: 139 mmol/L (ref 135–145)
Total Bilirubin: 1.3 mg/dL — ABNORMAL HIGH (ref 0.0–1.2)
Total Protein: 6.9 g/dL (ref 6.5–8.1)

## 2023-09-15 MED ORDER — METOCLOPRAMIDE HCL 5 MG/ML IJ SOLN
5.0000 mg | Freq: Once | INTRAMUSCULAR | Status: AC
  Administered 2023-09-15: 5 mg via INTRAVENOUS
  Filled 2023-09-15: qty 2

## 2023-09-15 MED ORDER — MECLIZINE HCL 25 MG PO TABS: 25.0000 mg | ORAL_TABLET | Freq: Three times a day (TID) | ORAL | 0 refills | Status: AC | PRN

## 2023-09-15 MED ORDER — SODIUM CHLORIDE 0.9 % IV BOLUS
1000.0000 mL | Freq: Once | INTRAVENOUS | Status: AC
  Administered 2023-09-15: 1000 mL via INTRAVENOUS

## 2023-09-15 MED ORDER — MECLIZINE HCL 25 MG PO TABS
25.0000 mg | ORAL_TABLET | Freq: Once | ORAL | Status: AC
  Administered 2023-09-15: 25 mg via ORAL
  Filled 2023-09-15: qty 1

## 2023-09-15 MED ORDER — DIPHENHYDRAMINE HCL 50 MG/ML IJ SOLN
12.5000 mg | Freq: Once | INTRAMUSCULAR | Status: AC
  Administered 2023-09-15: 12.5 mg via INTRAVENOUS
  Filled 2023-09-15: qty 1

## 2023-09-15 NOTE — ED Notes (Deleted)
 Pt discharged at 1515.

## 2023-09-15 NOTE — Discharge Instructions (Signed)
 Please follow-up with your family doctor in the office.  If you are continuing to have issues we may want to start you on physical therapy.  If you develop worsening symptoms or unable to walk then please return back to the emergency department for repeat evaluation.

## 2023-09-15 NOTE — ED Triage Notes (Signed)
 Pt arriving via [facility] from home with concern of dizziness since Tuesday. Hx of vertigo. Left knee replacement last week.

## 2023-09-15 NOTE — ED Provider Notes (Signed)
 [facility] AT [facility] Provider Note   CSN: [id number] Arrival date & time: 09/15/23  4098     History  Chief Complaint  Patient presents with   Dizziness    Crystal Spears is a 83 y.o. female.  83 yo F with a chief complaint of feeling dizzy.  She said that this been going on since about 6 days ago.  She said it has been coming and going.  She had been able to get up and still move around the house fairly well until last night.  Felt like she was unable to even sit up without feeling very dizzy.  Usually is worse with sitting or standing.  She does not notice it with head movement.  Denies headache or neck pain.  Denies cough congestion or fever.  She just had her left knee replaced.  Denies any chest pain or difficulty breathing.  Has had vertigo in the past and this is somewhat reminiscent of that.  Thinks maybe she has not been eating and drinking as well as she should be.  She had called her orthopedic surgeon and they told her to try to cut back on her narcotics which she has but without significant improvement.        Home Medications Prior to Admission medications   Medication Sig Start Date End Date Taking? Authorizing Provider  meclizine (ANTIVERT) 25 MG tablet Take 1 tablet (25 mg total) by mouth 3 (three) times daily as needed for dizziness. 09/15/23  Yes Melene Plan, DO  acetaminophen (TYLENOL) 500 MG tablet Take 2 tablets (1,000 mg total) by mouth every 8 (eight) hours as needed. 09/06/23 10/06/23  Joen Laura, MD  amoxicillin (AMOXIL) 500 MG tablet Take 2,000 mg by mouth See admin instructions. Take 4 tablets (2000 mg) by mouth 1 hour prior to dental appointments. 07/20/23   [provider]  aspirin EC 81 MG tablet Take 1 tablet (81 mg total) by mouth 2 (two) times daily for 28 days. Swallow whole. 09/07/23 10/05/23  Joen Laura, MD  atorvastatin (LIPITOR) 20 MG tablet Take 20 mg by mouth in the morning. 06/15/11    [provider]  Calcium Carbonate-Vitamin D (CALCIUM + D PO) Take 1 tablet by mouth 2 (two) times daily.    [provider]  diclofenac (VOLTAREN) 50 MG EC tablet Take 50 mg by mouth at bedtime.    [provider]  ketoconazole (NIZORAL) 2 % cream Apply topically as needed for irritation. 08/02/21   [provider]  lisinopril (ZESTRIL) 20 MG tablet Take 20 mg by mouth in the morning. 06/13/23   [provider]  methocarbamol (ROBAXIN) 500 MG tablet Take 1 tablet (500 mg total) by mouth every 8 (eight) hours as needed for up to 10 days for muscle spasms. 09/06/23 09/16/23  Joen Laura, MD  Multiple Vitamins-Minerals (PRESERVISION AREDS 2 PO) Take 1 tablet by mouth in the morning and at bedtime.    [provider]  ondansetron (ZOFRAN) 4 MG tablet Take 1 tablet (4 mg total) by mouth every 8 (eight) hours as needed for up to 14 days for nausea or vomiting. 09/06/23 09/20/23  Joen Laura, MD  Vitamin D, Ergocalciferol, (DRISDOL) 50000 UNITS CAPS Take 50,000 Units by mouth every Monday.    [provider]      Allergies    Patient has no known allergies.    Review of Systems   Review of Systems  Physical Exam Updated Vital Signs BP (!) 154/65   Pulse 73   Temp 98 F (36.7 C) (Oral)   Resp (!) 21   SpO2 98%  Physical Exam Vitals and nursing note reviewed.  Constitutional:      General: She is not in acute distress.    Appearance: She is well-developed. She is not diaphoretic.  HENT:     Head: Normocephalic and atraumatic.  Eyes:     Pupils: Pupils are equal, round, and reactive to light.  Cardiovascular:     Rate and Rhythm: Normal rate and regular rhythm.     Heart sounds: No murmur heard.    No friction rub. No gallop.  Pulmonary:     Effort: Pulmonary effort is normal.     Breath sounds: No wheezing or rales.  Abdominal:     General: There is no distension.     Palpations: Abdomen is soft.      Tenderness: There is no abdominal tenderness.  Musculoskeletal:        General: No tenderness.     Cervical back: Normal range of motion and neck supple.     Comments: Mepilex dressing in place over the left knee.  There is a small amount of bloody drainage at the superior aspect of the dressing.  I do not appreciate any obvious erythema or warmth to the knee.  Skin:    General: Skin is warm and dry.  Neurological:     Mental Status: She is alert and oriented to person, place, and time.     Cranial Nerves: Cranial nerves 2-12 are intact.     Sensory: Sensation is intact.     Motor: Motor function is intact.     Coordination: Coordination is intact.     Comments: Bilateral horizontal nystagmus.  She is able to sit up without obvious nystagmus but feels very unstable.  She has difficulty with heel-to-shin with the left lower extremity, due to her recent knee replacement  Psychiatric:        Behavior: Behavior normal.     ED Results / Procedures / Treatments   Labs (all labs ordered are listed, but only abnormal results are displayed) Labs Reviewed  CBC WITH DIFFERENTIAL/PLATELET - Abnormal; Notable for the following components:      Result Value   Platelets 401 (*)    All other components within normal limits  COMPREHENSIVE METABOLIC PANEL - Abnormal; Notable for the following components:   Glucose, Bld 104 (*)    Albumin 3.3 (*)    Alkaline Phosphatase 152 (*)    Total Bilirubin 1.3 (*)    All other components within normal limits  URINALYSIS, ROUTINE W REFLEX MICROSCOPIC - Abnormal; Notable for the following components:   Ketones, ur 20 (*)    All other components within normal limits    EKG EKG Interpretation Date/Time:  Friday September 15 2023 10:38:18 EST Ventricular Rate:  69 PR Interval:  148 QRS Duration:  86 QT Interval:  422 QTC Calculation: 452 R Axis:   6  Text Interpretation: Sinus rhythm with Premature atrial complexes with Abberant conduction Cannot rule  out Anterior infarct , age undetermined Abnormal ECG Since last tracing improved baseline Confirmed by Melene Plan [phone]) on 09/15/2023 10:45:31 AM  Radiology CT Head Wo Contrast Result Date: 09/15/2023 CLINICAL DATA:  Dizziness for several days, history of vertigo. EXAM: CT HEAD WITHOUT CONTRAST TECHNIQUE: Contiguous axial images were obtained from the base of the skull through the vertex without intravenous  contrast. RADIATION DOSE REDUCTION: This exam was performed according to the departmental dose-optimization program which includes automated exposure control, adjustment of the mA and/or kV according to patient size and/or use of iterative reconstruction technique. COMPARISON:  CT head 04/19/2017 FINDINGS: Brain: No acute intracranial hemorrhage. No CT evidence of acute infarct. Nonspecific hypoattenuation in the periventricular and subcortical white matter favored to reflect chronic microvascular ischemic changes. No edema, mass effect, or midline shift. The basilar cisterns are patent. Ventricles: The ventricles are normal. Vascular: Atherosclerotic calcifications of the carotid siphons. No hyperdense vessel. Skull: No acute or aggressive finding. Orbits: Orbits are symmetric. Sinuses: Mild mucosal thickening in the ethmoid sinuses and visualized left maxillary sinus. Other: Mastoid air cells are clear. IMPRESSION: 1. No acute intracranial abnormality. 2. Mild chronic microvascular ischemic changes. Electronically Signed   By: Emily Filbert M.D.   On: 09/15/2023 12:49   DG Chest Port 1 View Result Date: 09/15/2023 CLINICAL DATA:  Dizziness EXAM: PORTABLE CHEST 1 VIEW COMPARISON:  Chest x-ray 11/14/2011 FINDINGS: No consolidation, pneumothorax or effusion. No edema. Hyperinflation. Normal cardiopericardial silhouette. Overlapping cardiac leads. Surgical clips seen in the thoracic inlet. IMPRESSION: Hyperinflation.  No acute cardiopulmonary disease. Electronically Signed   By: Karen Kays M.D.   On:  09/15/2023 11:35    Procedures Procedures    Medications Ordered in ED Medications  sodium chloride 0.9 % bolus 1,000 mL (0 mLs Intravenous Stopped 09/15/23 1433)  metoCLOPramide (REGLAN) injection 5 mg (5 mg Intravenous Given 09/15/23 1023)  diphenhydrAMINE (BENADRYL) injection 12.5 mg (12.5 mg Intravenous Given 09/15/23 1023)  meclizine (ANTIVERT) tablet 25 mg (25 mg Oral Given 09/15/23 1022)  meclizine (ANTIVERT) tablet 25 mg (25 mg Oral Given 09/15/23 1452)    ED Course/ Medical Decision Making/ A&P                                 Medical Decision Making Amount and/or Complexity of Data Reviewed Labs: ordered. Radiology: ordered. ECG/medicine tests: ordered.  Risk Prescription drug management.   83 yo F with a chief complaint of feeling dizzy.  This has been going on for almost a week now.  Worse with trying to sit up or stand.  Significantly worse over the past 24 hours.  Unable to even sit up on her own.  Here for evaluation.  Broad differential.  Will obtain a laboratory evaluation bolus of IV fluids CT scan of the head.  Attempt to treat symptoms.  Reassess.  CT of the head without's obvious acute endocrine pathology.  UA negative for infection, no acute anemia noted leukocytosis.  She is feeling much better on repeat assessment.  She is able to sit up on her own and p.o. runs across her chest without issue.  She was able to ambulate here independently as well.  I did discuss MRI imaging.  Declining at this time.  Have her follow-up with family doctor in the office.  3:08 PM:  I have discussed the diagnosis/risks/treatment options with the patient.  Evaluation and diagnostic testing in the emergency department does not suggest an emergent condition requiring admission or immediate intervention beyond what has been performed at this time.  They will follow up with PCP, Ortho. We also discussed returning to the ED immediately if new or worsening sx occur. We discussed the sx  which are most concerning (e.g., sudden worsening pain, fever, inability to tolerate by mouth) that necessitate immediate return. Medications administered to  the patient during their visit and any new prescriptions provided to the patient are listed below.  Medications given during this visit Medications  sodium chloride 0.9 % bolus 1,000 mL (0 mLs Intravenous Stopped 09/15/23 1433)  metoCLOPramide (REGLAN) injection 5 mg (5 mg Intravenous Given 09/15/23 1023)  diphenhydrAMINE (BENADRYL) injection 12.5 mg (12.5 mg Intravenous Given 09/15/23 1023)  meclizine (ANTIVERT) tablet 25 mg (25 mg Oral Given 09/15/23 1022)  meclizine (ANTIVERT) tablet 25 mg (25 mg Oral Given 09/15/23 1452)     The patient appears reasonably screen and/or stabilized for discharge and I doubt any other medical condition or other [facility] requiring further screening, evaluation, or treatment in the ED at this time prior to discharge.          Final Clinical Impression(s) / ED Diagnoses Final diagnoses:  Dizziness    Rx / DC Orders ED Discharge Orders          Ordered    meclizine (ANTIVERT) 25 MG tablet  3 times daily PRN        09/15/23 1405              Melene Plan, DO 09/15/23 1508

## 2023-11-07 ENCOUNTER — Other Ambulatory Visit: Payer: Self-pay | Admitting: Endocrinology

## 2023-11-07 DIAGNOSIS — Z1231 Encounter for screening mammogram for malignant neoplasm of breast: Secondary | ICD-10-CM

## 2023-11-27 ENCOUNTER — Ambulatory Visit
Admission: RE | Admit: 2023-11-27 | Discharge: 2023-11-27 | Disposition: A | Discharge status: Home / Self Care | Source: Ambulatory Visit | Attending: Endocrinology | Admitting: Endocrinology

## 2023-11-27 DIAGNOSIS — Z1231 Encounter for screening mammogram for malignant neoplasm of breast: Secondary | ICD-10-CM

## 2023-11-30 ENCOUNTER — Other Ambulatory Visit: Payer: Self-pay | Admitting: Endocrinology

## 2023-11-30 DIAGNOSIS — R928 Other abnormal and inconclusive findings on diagnostic imaging of breast: Secondary | ICD-10-CM

## 2023-12-04 ENCOUNTER — Ambulatory Visit
Admission: RE | Admit: 2023-12-04 | Discharge: 2023-12-04 | Disposition: A | Discharge status: Home / Self Care | Source: Ambulatory Visit | Attending: Endocrinology | Admitting: Endocrinology

## 2023-12-04 DIAGNOSIS — R928 Other abnormal and inconclusive findings on diagnostic imaging of breast: Secondary | ICD-10-CM

## 2023-12-18 ENCOUNTER — Other Ambulatory Visit

## 2023-12-18 ENCOUNTER — Encounter
